# Patient Record
Sex: Male | Born: 1935
Health system: Southern US, Community
[De-identification: ages and names within clinical notes are randomized; demographics above are authoritative.]

## PROBLEM LIST (undated history)

## (undated) DIAGNOSIS — N400 Enlarged prostate without lower urinary tract symptoms: Secondary | ICD-10-CM

## (undated) DIAGNOSIS — Z950 Presence of cardiac pacemaker: Secondary | ICD-10-CM

## (undated) DIAGNOSIS — E119 Type 2 diabetes mellitus without complications: Secondary | ICD-10-CM

## (undated) DIAGNOSIS — E538 Deficiency of other specified B group vitamins: Secondary | ICD-10-CM

## (undated) DIAGNOSIS — I442 Atrioventricular block, complete: Secondary | ICD-10-CM

## (undated) DIAGNOSIS — I739 Peripheral vascular disease, unspecified: Secondary | ICD-10-CM

## (undated) DIAGNOSIS — E785 Hyperlipidemia, unspecified: Secondary | ICD-10-CM

## (undated) DIAGNOSIS — I10 Essential (primary) hypertension: Secondary | ICD-10-CM

## (undated) DIAGNOSIS — I779 Disorder of arteries and arterioles, unspecified: Secondary | ICD-10-CM

## (undated) DIAGNOSIS — R001 Bradycardia, unspecified: Secondary | ICD-10-CM

## (undated) DIAGNOSIS — M25559 Pain in unspecified hip: Secondary | ICD-10-CM

## (undated) HISTORY — DX: Essential (primary) hypertension: I10

## (undated) HISTORY — DX: Benign prostatic hyperplasia without lower urinary tract symptoms: N40.0

## (undated) HISTORY — DX: Deficiency of other specified B group vitamins: E53.8

## (undated) HISTORY — DX: Peripheral vascular disease, unspecified: I73.9

## (undated) HISTORY — DX: Atrioventricular block, complete: I44.2

## (undated) HISTORY — DX: Hyperlipidemia, unspecified: E78.5

## (undated) HISTORY — DX: Presence of cardiac pacemaker: Z95.0

## (undated) HISTORY — DX: Pain in unspecified hip: M25.559

## (undated) HISTORY — PX: CHOLECYSTECTOMY: SHX55

## (undated) HISTORY — DX: Type 2 diabetes mellitus without complications: E11.9

## (undated) HISTORY — DX: Disorder of arteries and arterioles, unspecified: I77.9

## (undated) HISTORY — DX: Bradycardia, unspecified: R00.1

---

## 2008-09-09 ENCOUNTER — Encounter: Admission: RE | Admit: 2008-09-09 | Discharge: 2008-09-09 | Payer: Self-pay | Admitting: Family Medicine

## 2008-11-12 ENCOUNTER — Inpatient Hospital Stay (HOSPITAL_BASED_OUTPATIENT_CLINIC_OR_DEPARTMENT_OTHER): Admission: RE | Admit: 2008-11-12 | Discharge: 2008-11-12 | Payer: Self-pay | Admitting: Cardiology

## 2008-11-12 HISTORY — PX: CORONARY ANGIOPLASTY WITH STENT PLACEMENT: SHX49

## 2009-04-29 ENCOUNTER — Ambulatory Visit: Payer: Self-pay | Admitting: Internal Medicine

## 2009-04-29 ENCOUNTER — Inpatient Hospital Stay (HOSPITAL_COMMUNITY): Admission: AD | Admit: 2009-04-29 | Discharge: 2009-05-01 | Payer: Self-pay | Admitting: Cardiology

## 2009-04-29 HISTORY — PX: PACEMAKER INSERTION: SHX728

## 2009-04-30 ENCOUNTER — Encounter: Payer: Self-pay | Admitting: Internal Medicine

## 2009-05-03 ENCOUNTER — Telehealth: Payer: Self-pay | Admitting: Internal Medicine

## 2009-05-13 ENCOUNTER — Encounter: Payer: Self-pay | Admitting: Internal Medicine

## 2009-05-13 ENCOUNTER — Ambulatory Visit: Payer: Self-pay

## 2009-08-10 ENCOUNTER — Ambulatory Visit: Payer: Self-pay | Admitting: Internal Medicine

## 2010-06-23 ENCOUNTER — Encounter: Payer: Self-pay | Admitting: Internal Medicine

## 2010-06-23 ENCOUNTER — Ambulatory Visit: Payer: Self-pay | Admitting: Internal Medicine

## 2010-06-23 DIAGNOSIS — I498 Other specified cardiac arrhythmias: Secondary | ICD-10-CM

## 2010-06-23 DIAGNOSIS — I10 Essential (primary) hypertension: Secondary | ICD-10-CM | POA: Insufficient documentation

## 2010-06-23 DIAGNOSIS — E785 Hyperlipidemia, unspecified: Secondary | ICD-10-CM

## 2010-08-16 NOTE — Cardiovascular Report (Signed)
Summary: Office Visit   Office Visit   Imported By: Roderic Ovens 08/12/2009 14:01:50  _____________________________________________________________________  External Attachment:    Type:   Image     Comment:   External Document

## 2010-08-16 NOTE — Assessment & Plan Note (Signed)
Summary: pc2. gd   Visit Type:  Pacemaker check Primary Provider:  Dr. Foy Guadalajara  CC:  right leg edema...denies any other cardiac complaints today.  History of Present Illness: [name] is seen in followup for high-grade heart block associated with bradycardia heart rates in the 20s. He is status post pacemaker implantation10/14/2010.    The patient denies SOB, chest pain, edema or palpitations   Current Medications (verified): 1)  Aspirin 325 Mg Tabs (Aspirin) .... One By Mouth Daily 2)  Calcium 500 Mg Tabs (Calcium Carbonate) .... One By Mouth Daily 3)  Ra Fish Oil 1000 Mg Caps (Omega-3 Fatty Acids) .... One By Mouth Daily 4)  Odorless Garlic 810 Mg Tabs (Garlic) .... One By Mouth Daily 5)  Ginkgo Biloba 50 Mg Caps (Ginkgo Biloba) .... One By Mouth Daily 6)  Glucosamine-Chondroitin  Caps (Glucosamine-Chondroit-Vit C-Mn) .... One - Two By Mouth Daily 7)  Niacinamide 100 Mg Tabs (Niacinamide) .... One By Mouth Daily 8)  Terazosin Hcl 10 Mg Caps (Terazosin Hcl) .... One Tab By Mouth Once Daily 9)  Vitamin C 500 Mg Tabs (Ascorbic Acid) .... One By Mouth Daily  Allergies (verified): No Known Drug Allergies  Past History:  Past Medical History: Last updated: 06/23/2010 HYPERLIPIDEMIA HYPERTENSION BRADYCARDIA PACEMAKER,MDT AV BLOCK, COMPLETE INTERMITTENT  Vital Signs:  Patient profile:   77 year old male Height:      63 inches Weight:      177.25 pounds BMI:     31.51 Pulse rate:   79 / minute Pulse rhythm:   regular BP sitting:   126 / 76  (left arm) Cuff size:   regular  Vitals Entered By: Danielle Rankin, CMA (June 23, 2010 10:26 AM)  Physical Exam  General:  The patient was alert and oriented in no acute distress. HEENT Normal.  Neck veins were flat, carotids were brisk.  Lungs were clear.  Heart sounds were regular without murmurs or gallops.  Abdomen was soft with active bowel sounds. There is no clubbing cyanosis or edema. Skin Warm and dry incision is  well-healed   EKG  Procedure date:  06/23/2010  Findings:      sus rhythm with P. synchronous pacing with AV delay of greater than 300 ms  PPM Specifications Following MD:  Sherryl Manges, MD     PPM Vendor:  Medtronic     PPM Model Number:  [id number]     PPM Serial Number:  [id number] PPM DOI:  04/29/2009     PPM Implanting MD:  Sherryl Manges, MD  Lead 1    Location: RA     DOI: 04/29/2009     Model #: 1324     Serial #: [id number]     Status: active Lead 2    Location: RV     DOI: 04/29/2009     Model #: 6440     Serial #: [id number]     Status: active  Magnet Response Rate:  BOL 85 ERI 65  Indications:  High grade heart block   PPM Follow Up Remote Check?  No Battery Voltage:  2.80 V     Battery Est. Longevity:  12.5 years     Pacer Dependent:  Yes       PPM Device Measurements Atrium  Amplitude: 4.0 mV, Impedance: 443 ohms, Threshold: 0.5 V at 0.4 msec Right Ventricle  Impedance: 663 ohms, Threshold: 0.5 V at 0.4 msec  Episodes Coumadin:  No  Parameters Mode:  DDD+  Lower Rate Limit:  60     Upper Rate Limit:  130 Paced AV Delay:  180     Sensed AV Delay:  150 Next Remote Date:  09/22/2010     Next Cardiology Appt Due:  06/17/2011 Tech Comments:  A-V delay reprogrammed as above.  Device function normal.  [facility]link transmissions every 3 months.  ROV 1 year with Dr. Graciela Husbands. Altha Harm, LPN  June 23, 2010 10:59 AM   Impression & Recommendations:  Problem # 1:  AV BLOCK, COMPLETE INTERMITTENT (ICD-426.0) the patient is now device dependent with complete heart block. He is stable post pacemaker.  His updated medication list for this problem includes:    Aspirin 325 Mg Tabs (Aspirin) ..... One by mouth daily  Problem # 2:  PACEMAKER,MDT (ICD-V45.01) Assessment: Deteriorated Device parameters and data were reviewed and he is AV delay was reprogrammed from 300-150 msec   Patient Instructions: 1)  Your physician recommends that you continue on your current  medications as directed. Please refer to the Current Medication list given to you today. 2)  Your physician wants you to follow-up in: 1year  You will receive a reminder letter in the mail two months in advance. If you don't receive a letter, please call our office to schedule the follow-up appointment.

## 2010-08-16 NOTE — Assessment & Plan Note (Signed)
Summary: pc2 medtronic   History of Present Illness: [name] is seen in followup for high-grade heart block associated with bradycardia heart rates in the 20s. He is status post pacemaker implantation10/14/2010.    He is feeling much better that he has "in years." There has been no recurrent dizziness exercise tolerance is improved. There has been no chest pain.  Current Medications (verified): 1)  Aspirin 325 Mg Tabs (Aspirin) .... One By Mouth Daily 2)  Calcium 500 Mg Tabs (Calcium Carbonate) .... One By Mouth Daily 3)  Ra Fish Oil 1000 Mg Caps (Omega-3 Fatty Acids) .... One By Mouth Daily 4)  Odorless Garlic 810 Mg Tabs (Garlic) .... One By Mouth Daily 5)  Ginkgo Biloba 50 Mg Caps (Ginkgo Biloba) .... One By Mouth Daily 6)  Glucosamine-Chondroitin  Caps (Glucosamine-Chondroit-Vit C-Mn) .... One - Two By Mouth Daily 7)  Niacinamide 100 Mg Tabs (Niacinamide) .... One By Mouth Daily 8)  Terazosin Hcl 10 Mg Caps (Terazosin Hcl) .... One Tab By Mouth Once Daily 9)  Vitamin C 500 Mg Tabs (Ascorbic Acid) .... One By Mouth Daily  Allergies (verified): No Known Drug Allergies  Past History:  Vital Signs:  Patient profile:   77 year old male Height:      63 inches Weight:      179.4 pounds Pulse rate:   92 / minute Pulse rhythm:   regular BP sitting:   148 / 86  (left arm)  Vitals Entered By: Minerva Areola, RN, BSN (August 10, 2009 2:18 PM)  Physical Exam  General:  The patient was alert and oriented in no acute distress. HEENT Normal.  Neck veins were flat, carotids were brisk.  Lungs were clear.  Heart sounds were regular without murmurs or gallops.  Abdomen was soft with active bowel sounds. There is no clubbing cyanosis or edema. Skin Warm and dry skin pocket was well-healed   PPM Specifications Following MD:  Sherryl Manges, MD     PPM Vendor:  Medtronic     PPM Model Number:  ADDRL1     PPM Serial Number:  [id number] H PPM DOI:  04/29/2009     PPM Implanting MD:   Sherryl Manges, MD  Lead 1    Location: RA     DOI: 04/29/2009     Model #: 4098     Serial #: [id number]     Status: active Lead 2    Location: RV     DOI: 04/29/2009     Model #: 6213     Serial #: [id number]     Status: active  Magnet Response Rate:  BOL 85 ERI 65  Indications:  High grade heart block   PPM Follow Up Remote Check?  No Battery Voltage:  2.8 V     Battery Est. Longevity:  9.5 years     Pacer Dependent:  No       PPM Device Measurements Atrium  Amplitude: 2.8 mV, Impedance: 471 ohms, Threshold: 0.5 V at 0.4 msec Right Ventricle  Amplitude: 22.40 mV, Impedance: 761 ohms, Threshold: 0.5 V at 0.4 msec  Episodes MS Episodes:  1     Percent Mode Switch:  <0.1%     Coumadin:  No Ventricular High Rate:  0     Atrial Pacing:  8.9%     Ventricular Pacing:  97.5%  Parameters Mode:  DDD+     Lower Rate Limit:  60     Upper Rate Limit:  130 Paced AV Delay:  200     Sensed AV Delay:  200 Tech Comments:  Checked by [name].  ROV 10/11 Dr. Zipporah Plants O'Dell, LPN  August 10, 2009 2:32 PM   Impression & Recommendations:  Problem # 1:  AV BLOCK, COMPLETE INTERMITTENT (ICD-426.0) with intermittent conduction stable post pacer insertion His updated medication list for this problem includes:    Aspirin 325 Mg Tabs (Aspirin) ..... One by mouth daily  His updated medication list for this problem includes:    Aspirin 325 Mg Tabs (Aspirin) ..... One by mouth daily  Problem # 2:  PACEMAKER,MDT (ICD-V45.01) Device parameters and data were reviewed and no changes were made   Pt is conducting through now with AV delay of 250-300 msec.  Options incl A>D mode which mght be associated with long AV delays or continuing as we are with Vpacing  I spoke with Dr Anne Fu and we will do the former with an AV delay of 300 and follow him  He is to see the good Dr Anne Fu in 5 -6 weeks  Other Orders: EKG w/ Interpretation (93000)  Patient Instructions: 1)  Your physician recommends that you  schedule a follow-up as needed   Physical Exam  General:  The patient was alert and oriented in no acute distress. HEENT Normal.  Neck veins were flat, carotids were brisk.  Lungs were clear.  Heart sounds were regular without murmurs or gallops.  Abdomen was soft with active bowel sounds. There is no clubbing cyanosis or edema. Skin Warm and dry skin pocket was well-healed    Impression & Recommendations:  Problem # 1:  AV BLOCK, COMPLETE INTERMITTENT (ICD-426.0) with intermittent conduction stable post pacer insertion His updated medication list for this problem includes:    Aspirin 325 Mg Tabs (Aspirin) ..... One by mouth daily  His updated medication list for this problem includes:    Aspirin 325 Mg Tabs (Aspirin) ..... One by mouth daily  Problem # 2:  PACEMAKER,MDT (ICD-V45.01) Device parameters and data were reviewed and no changes were made   Pt is conducting through now with AV delay of 250-300 msec.  Options incl A>D mode which mght be associated with long AV delays or continuing as we are with Vpacing  I spoke with Dr Anne Fu and we will do the former with an AV delay of 300 and follow him  He is to see the good Dr Anne Fu in 5 -6 weeks  Other Orders: EKG w/ Interpretation (93000)   Current Medications (verified): 1)  Aspirin 325 Mg Tabs (Aspirin) .... One By Mouth Daily 2)  Calcium 500 Mg Tabs (Calcium Carbonate) .... One By Mouth Daily 3)  Ra Fish Oil 1000 Mg Caps (Omega-3 Fatty Acids) .... One By Mouth Daily 4)  Odorless Garlic 810 Mg Tabs (Garlic) .... One By Mouth Daily 5)  Ginkgo Biloba 50 Mg Caps (Ginkgo Biloba) .... One By Mouth Daily 6)  Glucosamine-Chondroitin  Caps (Glucosamine-Chondroit-Vit C-Mn) .... One - Two By Mouth Daily 7)  Niacinamide 100 Mg Tabs (Niacinamide) .... One By Mouth Daily 8)  Terazosin Hcl 10 Mg Caps (Terazosin Hcl) .... One Tab By Mouth Once Daily 9)  Vitamin C 500 Mg Tabs (Ascorbic Acid) .... One By Mouth Daily  Allergies  (verified): No Known Drug Allergies

## 2010-09-27 ENCOUNTER — Encounter: Payer: Self-pay | Admitting: *Deleted

## 2010-10-04 NOTE — Letter (Signed)
Summary: Device-Delinquent Phone Journalist, newspaper, Main Office  [address]   [place], [place] [address]   Phone: [phone]  Fax: [phone]     September 27, 2010 MRN: [id number]   [facility] [address] [place], [place]  [address]   Dear Mr. Heikes,  According to our records, you were scheduled for a device phone transmission on 09-22-2010.     We did not receive any results from this check.  If you transmitted on your scheduled day, please call us to help troubleshoot your system.  If you forgot to send your transmission, please send one upon receipt of this letter.  Thank you,   Architectural technologist [facility]

## 2010-10-20 LAB — COMPREHENSIVE METABOLIC PANEL
ALT: 38 U/L (ref 0–53)
BUN: 13 mg/dL (ref 6–23)
CO2: 27 mEq/L (ref 19–32)
Calcium: 8.6 mg/dL (ref 8.4–10.5)
Chloride: 106 mEq/L (ref 96–112)
Creatinine, Ser: 1.21 mg/dL (ref 0.4–1.5)
GFR calc Af Amer: >60 mL/min (ref >60)
Glucose, Bld: 127 mg/dL — ABNORMAL HIGH (ref 70–99)
Sodium: 142 mEq/L (ref 135–145)
Total Protein: 6.1 g/dL (ref 6.0–8.3)

## 2010-10-20 LAB — APTT: aPTT: 32 seconds (ref 24–37)

## 2010-10-20 LAB — CBC
Hemoglobin: 12.2 g/dL — ABNORMAL LOW (ref 13.0–17.0)
MCHC: 34.5 g/dL (ref 30.0–36.0)
Platelets: 158 10*3/uL (ref 150–400)
RBC: 3.8 MIL/uL — ABNORMAL LOW (ref 4.22–5.81)
RDW: 12.9 % (ref 11.5–15.5)
WBC: 7.8 10*3/uL (ref 4.0–10.5)

## 2010-10-20 LAB — PROTIME-INR: Prothrombin Time: 14.5 seconds (ref 11.6–15.2)

## 2010-10-20 LAB — POCT I-STAT, CHEM 8
BUN: 15 mg/dL (ref 6–23)
Calcium, Ion: 1.12 mmol/L (ref 1.12–1.32)
Chloride: 104 mEq/L (ref 96–112)
Glucose, Bld: 130 mg/dL — ABNORMAL HIGH (ref 70–99)
HCT: 36 % — ABNORMAL LOW (ref 39.0–52.0)
TCO2: 25 mmol/L (ref 0–100)

## 2010-10-20 LAB — BASIC METABOLIC PANEL
Calcium: 8.2 mg/dL — ABNORMAL LOW (ref 8.4–10.5)
GFR calc Af Amer: >60 mL/min (ref >60)
Glucose, Bld: 112 mg/dL — ABNORMAL HIGH (ref 70–99)

## 2010-11-29 NOTE — Cardiovascular Report (Signed)
NAMETRAVARUS, TRUDO NO.:  [id number]   MEDICAL RECORD NO.:  [id number]          PATIENT TYPE:  OIB   LOCATION:  1962                         FACILITY:  [facility]   PHYSICIAN:  Jake Bathe, MD      DATE OF BIRTH:  1924-09-11   DATE OF PROCEDURE:  11/12/2008  DATE OF DISCHARGE:  11/12/2008                            CARDIAC CATHETERIZATION   PROCEDURES:  1. Left heart catheterization.  2. Selective coronary angiography.  3. Left ventriculogram.   INDICATIONS:  An 77 year old male with a history of syncope and  increased dyspnea on exertion with carotid Doppler showing 50-70% left  internal carotid artery plaque, history of left bundle-branch block and  recent nuclear stress test demonstrating apical/apical lateral wall with  reversible defect.  His ejection fraction was 69% on nuclear stress  test.  On echocardiogram, his ejection fraction was 45-50% with  inferobasal anteroseptal thinning and akinesis noted.   PROCEDURE DETAILS:  Informed consent was obtained.  Risk of stroke,  heart attack, death, renal impairment were explained to the patient at  length.  He was placed on the catheterization table, prepped in a  sterile fashion.  Lidocaine 1% was used for local anesthesia.  Fluoroscopy was used to visualize the femoral head.  A modified  Seldinger technique was used to place a 4-French sheath in the right  femoral artery.  A  Judkins left #4 catheter was used to selectively  cannulate the left main artery and a no-torque Williams right catheter  was used to selectively cannulate the right coronary artery.  Multiple  views with hand injection of Omnipaque were obtained.  An angled pigtail  catheter was used for left ventriculogram and for hemodynamics across  the aortic valve.  In the RAO position, a left ventriculogram was  performed with power injection of 36 mL of contrast.   FINDINGS:  1. Left main artery - splits into the left anterior descending,  ramus,      circumflex artery.  There was no angiographically significant      coronary artery disease.  2. Left anterior descending artery - there was one diagonal branch.      The mid-to-distal vessel is small in caliber, but there was no      angiographically significant coronary artery disease present.  3. Ramus branch - widely patent.  No angiographically significant      coronary artery disease.  4. Circumflex artery - there is the dominant vessel giving rise to the      posterior descending artery.  There was no angiographically      significant coronary artery disease.  There was one other obtuse      marginal branch.  5. Right coronary artery - small nondominant, no significant disease      present.  6. Left ventriculogram.  The inferoapical wall demonstrates minor      ballooning; however, ejection fraction appears to be 55%.  There      was no significant akinetic segments noted.  No significant mitral      regurgitation.  There was no aortic valve gradient.   HEMODYNAMICS:  Left ventricle 111 systolic with an end-diastolic  pressure of 18 mmHg.  Aortic pressure 100/45 with a mean of 63 - a mean  aortic valve gradient of 9.3 mmHg was calculated, less than mild.   IMPRESSION:  1. No angiographically significant coronary artery disease - mid-to-      distal left anterior descending, is small in caliber and given this      vessel diameter size.  This may be the reason for his abnormal      nuclear stress test.  2. Normal left ventricular ejection fraction of approximately 55% with      findings as above.  3. Mild aortic valve gradient as noted above.   PLAN:  Continue with medical management.  Imdur 30mg  started.  Treatment  of diastolic dysfunction.  We will see back in clinic in 2 weeks.      Jake Bathe, MD  Electronically Signed     MCS/MEDQ  D:  11/12/2008  T:  11/12/2008  Job:  [id number]   cc:   Dr. Jean Rosenthal

## 2011-01-20 ENCOUNTER — Telehealth: Payer: Self-pay

## 2011-01-20 DIAGNOSIS — R932 Abnormal findings on diagnostic imaging of liver and biliary tract: Secondary | ICD-10-CM

## 2011-01-20 NOTE — Telephone Encounter (Signed)
Pt scheduled for EUS has been  instructed and meds reviewed.  He will call with any further questions

## 2011-02-07 ENCOUNTER — Telehealth: Payer: Self-pay | Admitting: Gastroenterology

## 2011-02-07 NOTE — Telephone Encounter (Signed)
Procedure cx with Noreene Larsson.  FYI Dr Christella Hartigan

## 2011-02-07 NOTE — Telephone Encounter (Signed)
ok 

## 2011-02-16 ENCOUNTER — Encounter: Payer: Medicare Other | Admitting: Gastroenterology

## 2011-03-03 ENCOUNTER — Telehealth: Payer: Self-pay

## 2011-03-03 NOTE — Telephone Encounter (Signed)
Dr Christella Hartigan this pt was scheduled for EUS several weeks ago and was cx by Dr Braulio Conte because the pt was in the hospital.  They are calling back to reschedule now is this ok to reschedule?

## 2011-03-06 NOTE — Telephone Encounter (Signed)
Records are on your desk

## 2011-03-06 NOTE — Telephone Encounter (Signed)
Can you put his EUS "packet" on my desk to review.  I don't recall what it was about.

## 2011-03-08 NOTE — Telephone Encounter (Signed)
Dr Christella Hartigan request new pt appt before EUS scheduled.  Dr Meisnhiemer's nurse Clydie Braun called and appt made new pt packet mailed

## 2011-04-07 ENCOUNTER — Ambulatory Visit (INDEPENDENT_AMBULATORY_CARE_PROVIDER_SITE_OTHER): Payer: Medicare Other | Admitting: Gastroenterology

## 2011-04-07 ENCOUNTER — Encounter: Payer: Self-pay | Admitting: Gastroenterology

## 2011-04-07 VITALS — BP 132/78 | HR 80 | Ht 64.0 in | Wt 165.0 lb

## 2011-04-07 DIAGNOSIS — R933 Abnormal findings on diagnostic imaging of other parts of digestive tract: Secondary | ICD-10-CM

## 2011-04-07 NOTE — Progress Notes (Signed)
HPI: This is a   very pleasant 77 year old man who was hospitalized and asked for a this past April and then also about 2 months later. He initially presented with cholangitis, common bile duct stones, cholecystitis. He underwent an ERCP with stone removal. There was question of a biliary stricture at that time. Biliary brushings showed slightly atypical cells however no definite diagnosis of malignancy. There was also questionable liver mass and for that he had a liver biopsy during surgery, cholecystectomy. The liver biopsy suggested actually that he had infection in his liver. Indeed a month or so later he presented with intra-abdominal abscess that required drain placement. Initial imaging suggested a unusual area in the head of his pancreas. The ERCP did not document periventricular diverticulum but a repeat CT scan at the time of his abscess diagnosis did show he had a periampullary duodenal diverticulum.  Drain placed into abscess, stayed in for 2 weeks.  The drain has been out for a couple months.  He is finally getting back to his usual.  Working again.  He does have some peripheral neuropathy, mild.  No abd pains.  No jaundice.  He did have night sweats prior but none since (in past 2-3 months).  There is still a question about pancreas.    CT     Review of systems: Pertinent positive and negative review of systems were noted in the above HPI section.  All other review of systems was otherwise negative.   Past Medical History  Diagnosis Date  . Hyperlipidemia   . Hypertension   . Bradycardia   . Pacemaker   . AV block   . B12 deficiency     Past Surgical History  Procedure Date  . Pacemaker insertion   . Cholecystectomy      reports that he has never smoked. He has never used smokeless tobacco. He reports that he does not drink alcohol or use illicit drugs.  family history is not on file.    Current Medications, Allergies were all reviewed with the patient via [facility] electronic medical record system.    Physical Exam: BP 132/78  Pulse 80  Ht 5\' 4"  (1.626 m)  Wt 165 lb (74.844 kg)  BMI 28.32 kg/m2 Constitutional: generally well-appearing Psychiatric: alert and oriented x3 Eyes: extraocular movements intact Mouth: oral pharynx moist, no lesions Neck: supple no lymphadenopathy Cardiovascular: heart regular rate and rhythm Lungs: clear to auscultation bilaterally Abdomen: soft, nontender, nondistended, no obvious ascites, no peritoneal signs, normal bowel sounds Extremities: no lower extremity edema bilaterally Skin: no lesions on visible extremities    Assessment and plan: 77 y.o. male with abnormal appearing pancreas on imaging, recent cholangitis, intra-abdominal abscess, status post ERCP, atypical cells on biliary brushing  The patient proceed with endoscopic ultrasound at his soonest convenience. He looks remarkably well for all that he has been through. Suspect he has no lingering infection. He was never alcohol drinker, he has no family history of pancreatic problems and no pancreas problems himself in the past.

## 2011-04-07 NOTE — Patient Instructions (Signed)
You will be set up for an upper EUS at [facility], next available, radial scope.  Routine sedation. A copy of this information will be made available to Dr. Jennye Boroughs.

## 2011-04-10 ENCOUNTER — Encounter: Payer: Self-pay | Admitting: Gastroenterology

## 2011-05-04 ENCOUNTER — Ambulatory Visit (HOSPITAL_COMMUNITY)
Admission: RE | Admit: 2011-05-04 | Discharge: 2011-05-04 | Disposition: A | Discharge status: Home / Self Care | Payer: Medicare Other | Source: Ambulatory Visit | Attending: Gastroenterology | Admitting: Gastroenterology

## 2011-05-04 ENCOUNTER — Encounter: Payer: Medicare Other | Admitting: Gastroenterology

## 2011-05-04 DIAGNOSIS — E785 Hyperlipidemia, unspecified: Secondary | ICD-10-CM | POA: Insufficient documentation

## 2011-05-04 DIAGNOSIS — R933 Abnormal findings on diagnostic imaging of other parts of digestive tract: Secondary | ICD-10-CM

## 2011-05-04 DIAGNOSIS — I10 Essential (primary) hypertension: Secondary | ICD-10-CM | POA: Insufficient documentation

## 2011-05-04 DIAGNOSIS — Z9089 Acquired absence of other organs: Secondary | ICD-10-CM | POA: Insufficient documentation

## 2011-05-04 DIAGNOSIS — Z950 Presence of cardiac pacemaker: Secondary | ICD-10-CM | POA: Insufficient documentation

## 2013-07-07 ENCOUNTER — Encounter: Payer: Self-pay | Admitting: Internal Medicine

## 2013-09-03 ENCOUNTER — Encounter: Payer: Medicare Other | Admitting: Internal Medicine

## 2013-09-29 ENCOUNTER — Encounter: Payer: Self-pay | Admitting: Internal Medicine

## 2013-09-29 ENCOUNTER — Ambulatory Visit (INDEPENDENT_AMBULATORY_CARE_PROVIDER_SITE_OTHER): Payer: Medicare Other | Admitting: Internal Medicine

## 2013-09-29 VITALS — BP 146/79 | HR 73 | Ht 64.0 in | Wt 187.4 lb

## 2013-09-29 DIAGNOSIS — I498 Other specified cardiac arrhythmias: Secondary | ICD-10-CM

## 2013-09-29 DIAGNOSIS — I442 Atrioventricular block, complete: Secondary | ICD-10-CM

## 2013-09-29 LAB — MDC_IDC_ENUM_SESS_TYPE_INCLINIC
Battery Impedance: 226 Ohm
Battery Remaining Longevity: 114 mo
Battery Voltage: 2.8 V
Lead Channel Pacing Threshold Amplitude: 0.5 V
Lead Channel Pacing Threshold Pulse Width: 0.4 ms
Lead Channel Setting Pacing Amplitude: 1.5 V
Lead Channel Setting Pacing Amplitude: 2.5 V
Lead Channel Setting Pacing Pulse Width: 0.4 ms
MDC IDC MSMT LEADCHNL RA IMPEDANCE VALUE: 450 Ohm
MDC IDC MSMT LEADCHNL RA PACING THRESHOLD AMPLITUDE: 0.75 V
MDC IDC MSMT LEADCHNL RA PACING THRESHOLD PULSEWIDTH: 0.4 ms
MDC IDC MSMT LEADCHNL RA SENSING INTR AMPL: 2.8 mV
MDC IDC MSMT LEADCHNL RV IMPEDANCE VALUE: 744 Ohm
MDC IDC SESS DTM: 20150316111004
MDC IDC SET LEADCHNL RV SENSING SENSITIVITY: 5.6 mV
MDC IDC STAT BRADY AP VP PERCENT: 33 %
MDC IDC STAT BRADY AP VS PERCENT: 0 %
MDC IDC STAT BRADY AS VP PERCENT: 67 %
MDC IDC STAT BRADY AS VS PERCENT: 0 %

## 2013-09-29 NOTE — Patient Instructions (Addendum)
Your physician wants you to follow-up in: 12 months with Dr Gari Crown will receive a reminder letter in the mail two months in advance. If you don't receive a letter, please call our office to schedule the follow-up appointment.  Remote monitoring is used to monitor your Pacemaker of ICD from home. This monitoring reduces the number of office visits required to check your device to one time per year. It allows [place] to keep an eye on the functioning of your device to ensure it is working properly. You are scheduled for a device check from home on 12/31/13. You may send your transmission at any time that day. If you have a wireless device, the transmission will be sent automatically. After your physician reviews your transmission, you will receive a postcard with your next transmission date.

## 2013-09-29 NOTE — Progress Notes (Signed)
REDDING [name]., MD:  Primary Cardiologist:  Dr Kennieth Francois Scott [name] is a 80 y.o. male with a h/o bradycardia sp PPM (MDT) by Dr Caryl Comes who presents today to establish care in the Electrophysiology device clinic.   The patient reports doing very well since having a pacemaker implanted and remains very active despite his age.   Today, he  denies symptoms of palpitations, chest pain, shortness of breath, orthopnea, PND, lower extremity edema, dizziness, presyncope, syncope, or neurologic sequela.  The patientis tolerating medications without difficulties and is otherwise without complaint today.   Past Medical History  Diagnosis Date  . Hyperlipidemia   . Hypertension   . Pacemaker   . Complete heart block   . B12 deficiency   . Pain in joint, pelvic region and thigh   . Carotid artery disease   . BPH (benign prostatic hyperplasia)   . PVD (peripheral vascular disease)     LICA 07-37% - PVD   Past Surgical History  Procedure Laterality Date  . Pacemaker insertion  04/29/09    Medtronic Adapta L implanted by Dr Caryl Comes for AV block  . Cholecystectomy    . Cardiac catheterization    . Coronary angioplasty with stent placement  11/12/08    No angiographically significant CAD - mid to distal LAD small in caliber, question a degree of vasospasm. LVEDP-87mmHg, aoritc valve stenosis very mild 9.34mmHg.    History   Social History  . Marital Status: Married    Spouse Name: N/A    Number of Children: 60  . Years of Education: N/A   Occupational History  . Security     Social History Main Topics  . Smoking status: Never Smoker   . Smokeless tobacco: Never Used  . Alcohol Use: No  . Drug Use: No  . Sexual Activity: Not on file   Other Topics Concern  . Not on file   Social History Narrative   2 caffeine drinks daily     No family history on file.  No Known Allergies  Current Outpatient Prescriptions  Medication Sig Dispense Refill  . aspirin 325 MG tablet Take  325 mg by mouth daily.        Marland Kitchen atorvastatin (LIPITOR) 20 MG tablet Take 20 mg by mouth daily.      . Calcium Carbonate-Vitamin D (CALCIUM 500 + D PO) Take 1 capsule by mouth daily.       Marland Kitchen gabapentin (NEURONTIN) 300 MG capsule Take 300 mg by mouth 3 (three) times daily.        Marland Kitchen glucosamine-chondroitin 500-400 MG tablet Take 1-2 tablets by mouth daily.        Marland Kitchen lisinopril (PRINIVIL,ZESTRIL) 10 MG tablet Take 10 mg by mouth daily.      . metoprolol succinate (TOPROL-XL) 50 MG 24 hr tablet Take 1 tablet by mouth daily.      . silodosin (RAPAFLO) 8 MG CAPS capsule Take 8 mg by mouth daily with breakfast.        . vitamin B-12 (CYANOCOBALAMIN) 1000 MCG tablet Take 1,000 mcg by mouth daily.      . vitamin C (ASCORBIC ACID) 500 MG tablet Take 500 mg by mouth daily.         No current facility-administered medications for this visit.    ROS- all systems are reviewed and negative except as per HPI  Physical Exam: Filed Vitals:   09/29/13 1018  BP: 146/79  Pulse: 73  Height: 5\' 4"  (1.626  m)  Weight: 187 lb 6.4 oz (85.004 kg)    GEN- The patient is well appearing, alert and oriented x 3 today.   Head- normocephalic, atraumatic Eyes-  Sclera clear, conjunctiva pink Ears- hearing intact Oropharynx- clear Neck- supple, no JVP Lymph- no cervical lymphadenopathy Lungs- Clear to ausculation bilaterally, normal work of breathing Chest- pacemaker pocket is well healed Heart- Regular rate and rhythm, no murmurs, rubs or gallops, PMI not laterally displaced GI- soft, NT, ND, + BS Extremities- no clubbing, cyanosis, or edema MS- no significant deformity or atrophy Skin- no rash or lesion Psych- euthymic mood, full affect Neuro- strength and sensation are intact  Pacemaker interrogation- reviewed in detail today,  See PACEART report  Assessment and Plan:  1. Complete heart block Normal pacemaker function See Pace Art report No changes today  2. htn Stable No change required  today  carelink Return to see Dr Caryl Comes in 1 year Follow-up with Dr Marlou Porch as scheduled

## 2013-10-06 ENCOUNTER — Encounter: Payer: Medicare Other | Admitting: Internal Medicine

## 2013-10-22 ENCOUNTER — Encounter: Payer: Self-pay | Admitting: Internal Medicine

## 2013-12-31 ENCOUNTER — Telehealth: Payer: Self-pay | Admitting: Cardiology

## 2013-12-31 ENCOUNTER — Ambulatory Visit (INDEPENDENT_AMBULATORY_CARE_PROVIDER_SITE_OTHER): Payer: Medicare Other | Admitting: *Deleted

## 2013-12-31 DIAGNOSIS — I442 Atrioventricular block, complete: Secondary | ICD-10-CM

## 2013-12-31 LAB — MDC_IDC_ENUM_SESS_TYPE_REMOTE
Battery Remaining Longevity: 101 mo
Battery Voltage: 2.79 V
Brady Statistic AP VP Percent: 40.1 %
Lead Channel Impedance Value: 731 Ohm
Lead Channel Pacing Threshold Amplitude: 0.375 V
Lead Channel Pacing Threshold Pulse Width: 0.4 ms
Lead Channel Pacing Threshold Pulse Width: 0.4 ms
Lead Channel Sensing Intrinsic Amplitude: 1.4 mV
Lead Channel Setting Pacing Amplitude: 1.5 V
Lead Channel Setting Sensing Sensitivity: 5.6 mV
MDC IDC MSMT LEADCHNL RA IMPEDANCE VALUE: 469 Ohm
MDC IDC MSMT LEADCHNL RA PACING THRESHOLD AMPLITUDE: 0.625 V
MDC IDC SET LEADCHNL RV PACING AMPLITUDE: 2.5 V
MDC IDC SET LEADCHNL RV PACING PULSEWIDTH: 0.4 ms
MDC IDC STAT BRADY AP VS PERCENT: 0.1 %
MDC IDC STAT BRADY AS VP PERCENT: 59.7 %
MDC IDC STAT BRADY AS VS PERCENT: 0.2 %

## 2013-12-31 NOTE — Telephone Encounter (Signed)
Spoke with pt and reminded pt of remote transmission that is due today. Pt verbalized understanding.   

## 2013-12-31 NOTE — Progress Notes (Signed)
Remote pacemaker transmission.   

## 2014-01-20 ENCOUNTER — Encounter: Payer: Self-pay | Admitting: Cardiology

## 2014-01-28 ENCOUNTER — Encounter: Payer: Self-pay | Admitting: Cardiology

## 2014-04-06 ENCOUNTER — Ambulatory Visit (INDEPENDENT_AMBULATORY_CARE_PROVIDER_SITE_OTHER): Payer: Medicare Other | Admitting: *Deleted

## 2014-04-06 ENCOUNTER — Telehealth: Payer: Self-pay | Admitting: Cardiology

## 2014-04-06 DIAGNOSIS — I442 Atrioventricular block, complete: Secondary | ICD-10-CM

## 2014-04-06 NOTE — Telephone Encounter (Signed)
Spoke with pt and reminded pt of remote transmission that is due today. Pt verbalized understanding.   

## 2014-04-06 NOTE — Progress Notes (Signed)
Remote pacemaker transmission.   

## 2014-04-07 LAB — MDC_IDC_ENUM_SESS_TYPE_REMOTE
Battery Impedance: 297 Ohm
Brady Statistic AP VP Percent: 37 %
Brady Statistic AP VS Percent: 0 %
Brady Statistic AS VS Percent: 0 %
Date Time Interrogation Session: 20150921172124
Lead Channel Impedance Value: 470 Ohm
Lead Channel Impedance Value: 779 Ohm
Lead Channel Pacing Threshold Amplitude: 0.375 V
Lead Channel Pacing Threshold Amplitude: 0.625 V
Lead Channel Pacing Threshold Pulse Width: 0.4 ms
Lead Channel Pacing Threshold Pulse Width: 0.4 ms
Lead Channel Setting Pacing Amplitude: 2.5 V
Lead Channel Setting Sensing Sensitivity: 5.6 mV
MDC IDC MSMT BATTERY REMAINING LONGEVITY: 106 mo
MDC IDC MSMT BATTERY VOLTAGE: 2.79 V
MDC IDC MSMT LEADCHNL RA SENSING INTR AMPL: 1.4 mV
MDC IDC SET LEADCHNL RA PACING AMPLITUDE: 1.5 V
MDC IDC SET LEADCHNL RV PACING PULSEWIDTH: 0.4 ms
MDC IDC STAT BRADY AS VP PERCENT: 63 %

## 2014-04-20 ENCOUNTER — Encounter: Payer: Self-pay | Admitting: Cardiology

## 2014-04-23 ENCOUNTER — Encounter: Payer: Self-pay | Admitting: Cardiology

## 2014-07-07 ENCOUNTER — Other Ambulatory Visit: Payer: Self-pay

## 2014-07-07 ENCOUNTER — Ambulatory Visit (INDEPENDENT_AMBULATORY_CARE_PROVIDER_SITE_OTHER): Payer: Medicare Other | Admitting: *Deleted

## 2014-07-07 DIAGNOSIS — I442 Atrioventricular block, complete: Secondary | ICD-10-CM

## 2014-07-07 LAB — MDC_IDC_ENUM_SESS_TYPE_INCLINIC
Battery Remaining Longevity: 102 mo
Brady Statistic AS VP Percent: 64.1 %
Lead Channel Impedance Value: 759 Ohm
Lead Channel Pacing Threshold Amplitude: 0.375 V
Lead Channel Pacing Threshold Pulse Width: 0.4 ms
Lead Channel Pacing Threshold Pulse Width: 0.4 ms
Lead Channel Setting Pacing Amplitude: 1.5 V
Lead Channel Setting Pacing Amplitude: 2.5 V
Lead Channel Setting Pacing Pulse Width: 0.4 ms
MDC IDC MSMT BATTERY VOLTAGE: 2.8 V
MDC IDC MSMT LEADCHNL RA IMPEDANCE VALUE: 463 Ohm
MDC IDC MSMT LEADCHNL RA PACING THRESHOLD AMPLITUDE: 0.625 V
MDC IDC MSMT LEADCHNL RA SENSING INTR AMPL: 1.4 mV
MDC IDC SET LEADCHNL RV SENSING SENSITIVITY: 5.6 mV
MDC IDC STAT BRADY AP VP PERCENT: 35.8 %
MDC IDC STAT BRADY AP VS PERCENT: 0.1 %
MDC IDC STAT BRADY AS VS PERCENT: 0.2 %

## 2014-07-07 NOTE — Progress Notes (Signed)
Remote pacemaker transmission.   

## 2014-07-20 DIAGNOSIS — R748 Abnormal levels of other serum enzymes: Secondary | ICD-10-CM | POA: Diagnosis not present

## 2014-07-20 DIAGNOSIS — R945 Abnormal results of liver function studies: Secondary | ICD-10-CM | POA: Diagnosis not present

## 2014-07-23 ENCOUNTER — Encounter: Payer: Self-pay | Admitting: Cardiology

## 2014-07-27 ENCOUNTER — Encounter: Payer: Self-pay | Admitting: Cardiology

## 2014-08-12 DIAGNOSIS — R748 Abnormal levels of other serum enzymes: Secondary | ICD-10-CM | POA: Diagnosis not present

## 2014-08-17 ENCOUNTER — Encounter: Payer: Self-pay | Admitting: Vascular Surgery

## 2014-08-18 ENCOUNTER — Encounter: Payer: Self-pay | Admitting: Vascular Surgery

## 2014-08-18 ENCOUNTER — Ambulatory Visit (INDEPENDENT_AMBULATORY_CARE_PROVIDER_SITE_OTHER): Payer: Medicare Other | Admitting: Vascular Surgery

## 2014-08-18 VITALS — BP 151/77 | HR 61 | Resp 18 | Ht 63.0 in | Wt 183.0 lb

## 2014-08-18 DIAGNOSIS — I6523 Occlusion and stenosis of bilateral carotid arteries: Secondary | ICD-10-CM

## 2014-08-18 NOTE — Progress Notes (Signed)
Patient name: Scott [name] MRN: [id number] DOB: 1924-10-29 Sex: male   Referred by: Lin Landsman  Reason for referral:  Chief Complaint  Patient presents with  . New Evaluation    carotid stenosis referred by Dr Lin Landsman    HISTORY OF PRESENT ILLNESS: Patient is very pleasant 81 year old gentleman seen today for discussion of recent carotid duplex evaluation at [facility] on 07/03/2014. He denies any prior neurologic deficits. Specifically no amaurosis fugax transient ischemic attack or stroke. Does have a history dating back to approximately 2010 with coronary disease. Reports that he was told that time he had approximate 50% stenosis in his carotid arteries. Have more recent issue of severe bradycardia and had pacemaker placement and was had marked improvement in his tiredness since that time. No recent cardiac difficulties.  Past Medical History  Diagnosis Date  . Hyperlipidemia   . Hypertension   . Pacemaker   . Complete heart block   . B12 deficiency   . Pain in joint, pelvic region and thigh   . Carotid artery disease   . BPH (benign prostatic hyperplasia)   . PVD (peripheral vascular disease)     LICA 68-03% - PVD    Past Surgical History  Procedure Laterality Date  . Pacemaker insertion  04/29/09    Medtronic Adapta L implanted by Dr Caryl Comes for AV block  . Cholecystectomy    . Cardiac catheterization    . Coronary angioplasty with stent placement  11/12/08    No angiographically significant CAD - mid to distal LAD small in caliber, question a degree of vasospasm. LVEDP-5mmHg, aoritc valve stenosis very mild 9.76mmHg.    History   Social History  . Marital Status: Married    Spouse Name: N/A    Number of Children: 43  . Years of Education: N/A   Occupational History  . Security     Social History Main Topics  . Smoking status: Never Smoker   . Smokeless tobacco: Never Used  . Alcohol Use: No  . Drug Use: No  . Sexual Activity: Not on file    Other Topics Concern  . Not on file   Social History Narrative   2 caffeine drinks daily     History reviewed. No pertinent family history.  Allergies as of 08/18/2014  . (No Known Allergies)    Current Outpatient Prescriptions on File Prior to Visit  Medication Sig Dispense Refill  . aspirin 325 MG tablet Take 325 mg by mouth daily.      . Calcium Carbonate-Vitamin D (CALCIUM 500 + D PO) Take 1 capsule by mouth daily.     Marland Kitchen gabapentin (NEURONTIN) 300 MG capsule Take 300 mg by mouth 3 (three) times daily.      Marland Kitchen lisinopril (PRINIVIL,ZESTRIL) 10 MG tablet Take 10 mg by mouth daily.    . metoprolol succinate (TOPROL-XL) 50 MG 24 hr tablet Take 1 tablet by mouth daily.    . silodosin (RAPAFLO) 8 MG CAPS capsule Take 8 mg by mouth daily with breakfast.      . vitamin B-12 (CYANOCOBALAMIN) 1000 MCG tablet Take 1,000 mcg by mouth daily.    . vitamin C (ASCORBIC ACID) 500 MG tablet Take 500 mg by mouth daily.      Marland Kitchen atorvastatin (LIPITOR) 20 MG tablet Take 20 mg by mouth daily.    Marland Kitchen glucosamine-chondroitin 500-400 MG tablet Take 1-2 tablets by mouth daily.       No current facility-administered medications on file prior to  visit.     REVIEW OF SYSTEMS:  Positives indicated with an "X"  CARDIOVASCULAR:  [ ]  chest pain   [ ]  chest pressure   [ ]  palpitations   [ ]  orthopnea   [ ]  dyspnea on exertion   [ ]  claudication   [ ]  rest pain   [ ]  DVT   [ ]  phlebitis PULMONARY:   [ ]  productive cough   [ ]  asthma   [ ]  wheezing NEUROLOGIC:   [ ]  weakness  [ ]  paresthesias  [ ]  aphasia  [ ]  amaurosis  [ ]  dizziness HEMATOLOGIC:   [ ]  bleeding problems   [ ]  clotting disorders MUSCULOSKELETAL:  [ ]  joint pain   [ ]  joint swelling GASTROINTESTINAL: [ ]   blood in stool  [ ]   hematemesis GENITOURINARY:  [ ]   dysuria  [ ]   hematuria PSYCHIATRIC:  [ ]  history of major depression INTEGUMENTARY:  [ ]  rashes  [ ]  ulcers CONSTITUTIONAL:  [ ]  fever   [ ]  chills  PHYSICAL EXAMINATION:  General:  The patient is a well-nourished male, in no acute distress. Vital signs are BP 151/77 mmHg  Pulse 61  Resp 18  Ht 5\' 3"  (1.6 m)  Wt 183 lb (83.008 kg)  BMI 32.43 kg/m2 Pulmonary: There is a good air exchange bilaterally without wheezing or rales.  Musculoskeletal: There are no major deformities.  There is no significant extremity pain. Neurologic: No focal weakness or paresthesias are detected, Skin: There are no ulcer or rashes noted. Psychiatric: The patient has normal affect. Cardiovascular: 2+ femoral and 2+ radial pulses. 2+ dorsalis pedis pulses bilaterally Carotid arteries without bruits bilaterally  I reviewed his studies from [facility]. This reveals moderate bilateral carotid stenosis. This is somewhat more significant up to 70% in his right carotid bulb.  Impression and Plan:  Asymptomatic moderate carotid stenosis and 81 year old gentleman. I discussed symptoms of carotid disease at length to the patient and his wife present. I explained that we would recommend reimaging this in one year to rule out any progression. I explained that if he develops severe stenosis that we would consider elective endarterectomy. He is quite active and animated. His main concern is how long this would keep him from returning to work. I explained that this would only require a 1-2 week break from work. I suspect that he will not require any treatment lifelong. We will see him again in one year for continued follow-up and repeat ultrasound    Megen Madewell [facility] [place] Office: [phone]

## 2014-08-19 ENCOUNTER — Other Ambulatory Visit: Payer: Self-pay | Admitting: *Deleted

## 2014-08-19 DIAGNOSIS — I6523 Occlusion and stenosis of bilateral carotid arteries: Secondary | ICD-10-CM

## 2014-08-27 DIAGNOSIS — K5732 Diverticulitis of large intestine without perforation or abscess without bleeding: Secondary | ICD-10-CM | POA: Diagnosis not present

## 2014-08-27 DIAGNOSIS — R7989 Other specified abnormal findings of blood chemistry: Secondary | ICD-10-CM | POA: Diagnosis not present

## 2014-09-25 DIAGNOSIS — R7989 Other specified abnormal findings of blood chemistry: Secondary | ICD-10-CM | POA: Diagnosis not present

## 2014-10-09 ENCOUNTER — Encounter: Payer: Self-pay | Admitting: Internal Medicine

## 2014-10-09 ENCOUNTER — Encounter: Payer: Medicare Other | Admitting: Internal Medicine

## 2014-10-09 ENCOUNTER — Ambulatory Visit (INDEPENDENT_AMBULATORY_CARE_PROVIDER_SITE_OTHER): Payer: Medicare Other | Admitting: Internal Medicine

## 2014-10-09 VITALS — BP 138/82 | HR 86 | Ht 63.0 in | Wt 184.6 lb

## 2014-10-09 DIAGNOSIS — Z45018 Encounter for adjustment and management of other part of cardiac pacemaker: Secondary | ICD-10-CM

## 2014-10-09 DIAGNOSIS — I442 Atrioventricular block, complete: Secondary | ICD-10-CM

## 2014-10-09 LAB — MDC_IDC_ENUM_SESS_TYPE_INCLINIC
Battery Impedance: 321 Ohm
Brady Statistic AP VS Percent: 0 %
Brady Statistic AS VS Percent: 0 %
Date Time Interrogation Session: 20160325154438
Lead Channel Pacing Threshold Amplitude: 0.5 V
Lead Channel Pacing Threshold Amplitude: 0.5 V
Lead Channel Pacing Threshold Pulse Width: 0.4 ms
Lead Channel Setting Pacing Amplitude: 2.5 V
Lead Channel Setting Pacing Pulse Width: 0.4 ms
Lead Channel Setting Sensing Sensitivity: 5.6 mV
MDC IDC MSMT BATTERY REMAINING LONGEVITY: 104 mo
MDC IDC MSMT BATTERY VOLTAGE: 2.79 V
MDC IDC MSMT LEADCHNL RA IMPEDANCE VALUE: 476 Ohm
MDC IDC MSMT LEADCHNL RA SENSING INTR AMPL: 4 mV
MDC IDC MSMT LEADCHNL RV IMPEDANCE VALUE: 800 Ohm
MDC IDC MSMT LEADCHNL RV PACING THRESHOLD PULSEWIDTH: 0.4 ms
MDC IDC SET LEADCHNL RA PACING AMPLITUDE: 1.5 V
MDC IDC STAT BRADY AP VP PERCENT: 31 %
MDC IDC STAT BRADY AS VP PERCENT: 69 %

## 2014-10-09 MED ORDER — ASPIRIN EC 81 MG PO TBEC: 81.0000 mg | DELAYED_RELEASE_TABLET | Freq: Every day | ORAL | Status: DC

## 2014-10-09 MED ORDER — FUROSEMIDE 20 MG PO TABS: ORAL_TABLET | ORAL | Status: DC

## 2014-10-09 NOTE — Progress Notes (Signed)
Patient Care Team: Angelina Sheriff, MD as PCP - General (Unknown Physician Specialty)   HPI  Scott [name] is a 81 y.o. male Seen in follow-up for pacemaker implanted many years ago. He has complete heart block. He saw Dr. Rayann Heman about a year ago.  He has had problems with peripheral edema and some dyspnea on exertion. He has had no syncope. He denies chest pain. Remote catheterization demonstrated no obstructive coronary disease (2010) Past Medical History  Diagnosis Date  . Hyperlipidemia   . Hypertension   . Pacemaker   . Complete heart block   . B12 deficiency   . Pain in joint, pelvic region and thigh   . Carotid artery disease   . BPH (benign prostatic hyperplasia)   . PVD (peripheral vascular disease)     LICA 37-85% - PVD    Past Surgical History  Procedure Laterality Date  . Pacemaker insertion  04/29/09    Medtronic Adapta L implanted by Dr Caryl Comes for AV block  . Cholecystectomy    . Cardiac catheterization    . Coronary angioplasty with stent placement  11/12/08    No angiographically significant CAD - mid to distal LAD small in caliber, question a degree of vasospasm. LVEDP-58mmHg, aoritc valve stenosis very mild 9.77mmHg.    Current Outpatient Prescriptions  Medication Sig Dispense Refill  . aspirin 325 MG tablet Take 325 mg by mouth daily.      . Calcium Carbonate-Vitamin D (CALCIUM 500 + D PO) Take 1 capsule by mouth daily.     . Cholecalciferol (VITAMIN D3) 1000 UNITS CAPS Take 1,000 Units by mouth daily.    Marland Kitchen gabapentin (NEURONTIN) 300 MG capsule Take 300 mg by mouth 3 (three) times daily.      . Garlic 8850 MG CAPS Take 1,000 mg by mouth daily.    . Glucosamine-Chondroitin-MSM (TRIPLE FLEX PO) Take 1 tablet by mouth 3 (three) times daily.    Marland Kitchen lisinopril (PRINIVIL,ZESTRIL) 10 MG tablet Take 10 mg by mouth daily.    . metoprolol succinate (TOPROL-XL) 50 MG 24 hr tablet Take 1 tablet by mouth daily.    . silodosin (RAPAFLO) 8 MG CAPS capsule  Take 8 mg by mouth daily with breakfast.      . vitamin B-12 (CYANOCOBALAMIN) 1000 MCG tablet Take 1,000 mcg by mouth daily.    . vitamin C (ASCORBIC ACID) 500 MG tablet Take 500 mg by mouth daily.       No current facility-administered medications for this visit.    No Known Allergies  Review of Systems negative except from HPI and PMH  Physical Exam BP 138/82 mmHg  Pulse 86  Ht 5\' 3"  (1.6 m)  Wt 184 lb 9.6 oz (83.734 kg)  BMI 32.71 kg/m2 Well developed and well nourished in no acute distress HENT normal E scleral and icterus clear Neck Supple JVP 7-8; carotids brisk and full Clear to ausculation  Regular rate and rhythm, 2/6 early systolic murmur  Soft with active bowel sounds No clubbing cyanosis 2+ Edema Alert and oriented, grossly normal motor and sensory function Skin Warm and Dry  ECG demonstrates sinus rhythm at 86 with P synchronous pacing    Assessment and  Plan  Complete heart block  Pacemaker-Medtronic  Congestive heart failure question mechanism  Hypertension  Blood pressure is reasonably controlled. There is significant fluid overload and likely contributing to his dyspnea on exertion. We'll begin him on low-dose diuretic and 20 mg furosemide  every other day for 2 weeks. I have told him that his target weight loss is 5-8 pounds.  We will give him a prescription so as to allow his metabolic profile to be drawn when he supposed to get blood work drawn for his liver issues in a couple of weeks   We will also obtain a 2-D echo to look at left ventricular function

## 2014-10-09 NOTE — Patient Instructions (Addendum)
Your physician has recommended you make the following change in your medication:  1) DECREASE Aspirin to 81 mg daily 2) START Furosemide 20 mg -- take one tablet every other day for 2 weeks  Please have BMET drawn (handwritten prescription given to you)   Your physician has requested that you have an echocardiogram. Echocardiography is a painless test that uses sound waves to create images of your heart. It provides your doctor with information about the size and shape of your heart and how well your heart's chambers and valves are working. This procedure takes approximately one hour. There are no restrictions for this procedure.  Remote monitoring is used to monitor your Pacemaker of ICD from home. This monitoring reduces the number of office visits required to check your device to one time per year. It allows [place] to keep an eye on the functioning of your device to ensure it is working properly. You are scheduled for a device check from home on 01/11/15. You may send your transmission at any time that day. If you have a wireless device, the transmission will be sent automatically. After your physician reviews your transmission, you will receive a postcard with your next transmission date.  Your physician wants you to follow-up in: 1 year with Dr. Caryl Comes.  You will receive a reminder letter in the mail two months in advance. If you don't receive a letter, please call our office to schedule the follow-up appointment.

## 2014-10-16 ENCOUNTER — Encounter: Payer: Self-pay | Admitting: Internal Medicine

## 2014-10-16 DIAGNOSIS — K5732 Diverticulitis of large intestine without perforation or abscess without bleeding: Secondary | ICD-10-CM | POA: Diagnosis not present

## 2014-10-16 DIAGNOSIS — Z79899 Other long term (current) drug therapy: Secondary | ICD-10-CM | POA: Diagnosis not present

## 2014-10-20 ENCOUNTER — Encounter: Payer: Medicare Other | Admitting: Internal Medicine

## 2014-10-22 ENCOUNTER — Ambulatory Visit (HOSPITAL_COMMUNITY): Payer: Medicare Other | Attending: Cardiology | Admitting: Radiology

## 2014-10-22 DIAGNOSIS — I442 Atrioventricular block, complete: Secondary | ICD-10-CM | POA: Diagnosis not present

## 2014-10-22 NOTE — Progress Notes (Signed)
Echocardiogram performed.  

## 2014-10-26 ENCOUNTER — Other Ambulatory Visit: Payer: Self-pay | Admitting: Internal Medicine

## 2014-11-04 ENCOUNTER — Telehealth: Payer: Self-pay

## 2014-11-04 NOTE — Telephone Encounter (Signed)
Pt made aware of results no questions at this time.  Reviewed with pt Lasix as pt has c/o that medication has not helped with weight. Pt has been off of Lasix for little over a week and has not had any changes in weight while on the medication and since being off.  Pt stated he still occasionally has SOB with activity.  Pt would like to know how to proceed. Pt weight yesterday was 181 and at one point got down to 175 but he stated that is with just his shorts on if he has cloths on, like when he is in the office, it is 184.4 lbs.

## 2014-11-04 NOTE — Telephone Encounter (Signed)
-----   Message from Deboraha Sprang, MD sent at 11/03/2014  7:51 AM EDT ----- Please Inform Patient that echo essentially  Normal--this is great news   Thanks

## 2014-11-11 NOTE — Telephone Encounter (Signed)
Informed patient to increase Lasix 40 mg QOD for 2 weeks. He will follow up at PCP office (Dr. Lin Landsman) in [place] in couple weeks for BMET.  I will fax order for this. Patient instructed to call me at end of 2 weeks and let us know progress.  He is agreeable and verbalized understanding of instructions.

## 2014-11-11 NOTE — Telephone Encounter (Signed)
lmtcb  (when patient calls back inform him:  Take Lasix 40 mg every other day for 2 weeks and increase potassium rich food intake during this time.  Will need to schedule f/u BMET after he completes his 2 week Lasix)

## 2014-11-12 ENCOUNTER — Other Ambulatory Visit: Payer: Self-pay | Admitting: Internal Medicine

## 2014-11-12 NOTE — Telephone Encounter (Signed)
Sent wrong amt in -- advised patient on 4/27 to take 40 mg QOD for 2 weeks per Caryl Comes. Will fix this and send updated rx with correct tablet number.

## 2014-11-12 NOTE — Telephone Encounter (Signed)
Just wanted to clarify with you. Patient should no longer be taking this? Please advise. Thanks, MI

## 2014-11-17 ENCOUNTER — Telehealth: Payer: Self-pay | Admitting: Internal Medicine

## 2014-11-17 NOTE — Telephone Encounter (Signed)
Informed patient I cannot find documentation of anyone in our office calling him yesterday. Explained that this may be from last week.  Patient agreed.

## 2014-11-17 NOTE — Telephone Encounter (Signed)
New message         Pt returning nurse call per pt

## 2014-12-01 ENCOUNTER — Telehealth: Payer: Self-pay | Admitting: Internal Medicine

## 2014-12-01 MED ORDER — FUROSEMIDE 20 MG PO TABS: 40.0000 mg | ORAL_TABLET | ORAL | Status: DC

## 2014-12-01 NOTE — Telephone Encounter (Signed)
New message     Talk to Scott [name] to give update and to be scheduled for a blood test

## 2014-12-01 NOTE — Telephone Encounter (Signed)
Patient states that he has seen improvement with taking  Lasix QOD.  Patient advised that we will continue this as it is "working".   He will obtain lab work on Thursday at Dr. Janace Aris office. I will follow up with this by next week if have not received the labs. Patient verbalized understanding and agreeable to plan.

## 2014-12-03 ENCOUNTER — Telehealth: Payer: Self-pay | Admitting: Internal Medicine

## 2014-12-03 DIAGNOSIS — K5732 Diverticulitis of large intestine without perforation or abscess without bleeding: Secondary | ICD-10-CM | POA: Diagnosis not present

## 2014-12-03 DIAGNOSIS — Z79899 Other long term (current) drug therapy: Secondary | ICD-10-CM | POA: Diagnosis not present

## 2014-12-03 NOTE — Telephone Encounter (Signed)
New message       Returning Scott [name]'s call.  Pt did have lab work today----ok to wait until tomorrow to call so that you will have the results

## 2014-12-08 ENCOUNTER — Encounter: Payer: Self-pay | Admitting: Internal Medicine

## 2014-12-11 NOTE — Telephone Encounter (Signed)
Lm for Dr Redding's nurse to call me back about lab work last week. Called patient and informed him that I am waiting on results and will call him once I have them. Patient verbalized understanding and agreeable to plan.

## 2014-12-17 NOTE — Telephone Encounter (Signed)
Received lab results.  Will review with Dr. Caryl Comes next week when he returns to office

## 2015-01-11 ENCOUNTER — Telehealth: Payer: Self-pay | Admitting: Cardiology

## 2015-01-11 ENCOUNTER — Ambulatory Visit (INDEPENDENT_AMBULATORY_CARE_PROVIDER_SITE_OTHER): Payer: Medicare Other | Admitting: *Deleted

## 2015-01-11 DIAGNOSIS — I442 Atrioventricular block, complete: Secondary | ICD-10-CM | POA: Diagnosis not present

## 2015-01-11 LAB — CUP PACEART REMOTE DEVICE CHECK
Battery Remaining Longevity: 97 mo
Brady Statistic AS VP Percent: 78 %
Brady Statistic AS VS Percent: 0 %
Date Time Interrogation Session: 20160627151044
Lead Channel Impedance Value: 451 Ohm
Lead Channel Pacing Threshold Amplitude: 0.375 V
Lead Channel Pacing Threshold Amplitude: 0.5 V
Lead Channel Pacing Threshold Pulse Width: 0.4 ms
Lead Channel Pacing Threshold Pulse Width: 0.4 ms
Lead Channel Sensing Intrinsic Amplitude: 1.4 mV
Lead Channel Setting Pacing Amplitude: 1.5 V
Lead Channel Setting Pacing Amplitude: 2.5 V
MDC IDC MSMT BATTERY IMPEDANCE: 393 Ohm
MDC IDC MSMT BATTERY VOLTAGE: 2.79 V
MDC IDC MSMT LEADCHNL RV IMPEDANCE VALUE: 795 Ohm
MDC IDC SET LEADCHNL RV PACING PULSEWIDTH: 0.4 ms
MDC IDC SET LEADCHNL RV SENSING SENSITIVITY: 5.6 mV
MDC IDC STAT BRADY AP VP PERCENT: 22 %
MDC IDC STAT BRADY AP VS PERCENT: 0 %

## 2015-01-11 NOTE — Telephone Encounter (Signed)
Spoke with pt and reminded pt of remote transmission that is due today. Pt verbalized understanding.   

## 2015-01-11 NOTE — Progress Notes (Signed)
Remote pacemaker transmission.   

## 2015-01-21 ENCOUNTER — Telehealth: Payer: Self-pay | Admitting: Internal Medicine

## 2015-01-21 NOTE — Telephone Encounter (Signed)
New message   Pt c/o swelling: STAT is pt has developed SOB within 24 hours  1. How long have you been experiencing swelling? "it varies" he has the fluid loss but sometimes it doesn't work.  2. Where is the swelling located? Legs   3.  Are you currently taking a "fluid pill"?yes   4.  Are you currently SOB? No   5.  Have you traveled recently?no

## 2015-01-21 NOTE — Telephone Encounter (Signed)
Patient describes BLEE, pitting 1+. He did lose 5 pds, but has gained it back last few days. States that once out of 6 days does he see a difference in urination.   Will review with Dr. Caryl Comes and the call him back -- pt is agreeable to this plan.

## 2015-01-22 NOTE — Telephone Encounter (Addendum)
Left detailed message to increase Furosemide to daily.  Advised I will call him Monday to check in on him and make sure message received.

## 2015-01-26 MED ORDER — FUROSEMIDE 20 MG PO TABS
40.0000 mg | ORAL_TABLET | Freq: Every day | ORAL | Status: DC
Start: 2015-01-26 — End: 2015-03-15

## 2015-01-26 NOTE — Telephone Encounter (Signed)
Patient states he received phone call Friday, but didn't hear entire message.  Instructed patient to increase Furosemide to daily.  Updated rx sent to [address] per pt request. Will send order to Dr. Lin Landsman, PCP, for follow up lab work in a few weeks. Patient verbalized understanding and agreeable to plan.

## 2015-02-15 DIAGNOSIS — Z79899 Other long term (current) drug therapy: Secondary | ICD-10-CM | POA: Diagnosis not present

## 2015-02-17 ENCOUNTER — Encounter: Payer: Self-pay | Admitting: Internal Medicine

## 2015-02-19 ENCOUNTER — Encounter: Payer: Self-pay | Admitting: Cardiology

## 2015-02-26 ENCOUNTER — Encounter: Payer: Self-pay | Admitting: Cardiology

## 2015-03-10 ENCOUNTER — Telehealth: Payer: Self-pay | Admitting: *Deleted

## 2015-03-10 NOTE — Telephone Encounter (Signed)
Telephone triage form filled out by operator Shelby Mattocks at 3:35 pm (due to epic being down) and handed to triage in regards to the pt calling to ask Dr Caryl Comes about having problems with his legs and does he need to go back to his PCP for this.  Attempted to call the pt x 2 and no answer.  Left message for the pt to call back tomorrow and ask to speak to a triage nurse.  Will defer this message to triage desktop for further follow-up.

## 2015-03-11 NOTE — Telephone Encounter (Signed)
Patient calling returning call from triage yesterday. Patient has slight swelling in BLE. Patient stated he was on Lasix for 2 weeks, but he does not feel that this helped as much as it should. Informed patient that Dr. Caryl Comes and Venida Jarvis, his nurse are out of the office this week. He stated he would wait until Dr. Caryl Comes was back, that his swelling is really not bad at all. Encouraged patient to elevate his BLE while sitting and reduce salt intake. Informed patient that it would not hurt to talk to his PCP about this also. Patient preferrs that Dr. Caryl Comes advise him on this. Will forward to Dr. Caryl Comes and Venida Jarvis.

## 2015-03-14 NOTE — Telephone Encounter (Signed)
Lets try increasing his furosemide from 20 >>40 qod for one week and see if this helps

## 2015-03-15 MED ORDER — FUROSEMIDE 40 MG PO TABS: 40.0000 mg | ORAL_TABLET | ORAL | Status: DC

## 2015-03-15 NOTE — Addendum Note (Signed)
Addended by: Dalphine Handing on: 03/15/2015 08:32 AM   Modules accepted: Orders

## 2015-03-15 NOTE — Telephone Encounter (Signed)
Called patient about Dr. Olin Pia response. Sent order to pharmacy of patient's choice. Patient will call back in a week to see if Lasix helped. Patient verbalized understanding.

## 2015-04-12 ENCOUNTER — Telehealth: Payer: Self-pay | Admitting: Cardiology

## 2015-04-12 ENCOUNTER — Ambulatory Visit (INDEPENDENT_AMBULATORY_CARE_PROVIDER_SITE_OTHER): Payer: Medicare Other | Admitting: *Deleted

## 2015-04-12 DIAGNOSIS — I442 Atrioventricular block, complete: Secondary | ICD-10-CM | POA: Diagnosis not present

## 2015-04-12 NOTE — Telephone Encounter (Signed)
LMOVM reminding pt to send remote transmission.   

## 2015-04-13 LAB — CUP PACEART REMOTE DEVICE CHECK
Battery Impedance: 442 Ohm
Brady Statistic AP VP Percent: 26 %
Brady Statistic AP VS Percent: 0 %
Brady Statistic AS VP Percent: 74 %
Brady Statistic AS VS Percent: 0 %
Lead Channel Impedance Value: 438 Ohm
Lead Channel Impedance Value: 750 Ohm
Lead Channel Pacing Threshold Amplitude: 0.25 V
Lead Channel Pacing Threshold Amplitude: 0.625 V
Lead Channel Pacing Threshold Pulse Width: 0.4 ms
Lead Channel Sensing Intrinsic Amplitude: 1.4 mV
Lead Channel Setting Pacing Amplitude: 1.5 V
Lead Channel Setting Pacing Pulse Width: 0.4 ms
MDC IDC MSMT BATTERY REMAINING LONGEVITY: 92 mo
MDC IDC MSMT BATTERY VOLTAGE: 2.79 V
MDC IDC MSMT LEADCHNL RV PACING THRESHOLD PULSEWIDTH: 0.4 ms
MDC IDC SESS DTM: 20160926162443
MDC IDC SET LEADCHNL RV PACING AMPLITUDE: 2.5 V
MDC IDC SET LEADCHNL RV SENSING SENSITIVITY: 5.6 mV

## 2015-04-13 NOTE — Progress Notes (Signed)
Remote pacemaker transmission.   

## 2015-04-16 ENCOUNTER — Encounter: Payer: Self-pay | Admitting: Cardiology

## 2015-04-19 DIAGNOSIS — R6889 Other general symptoms and signs: Secondary | ICD-10-CM | POA: Diagnosis not present

## 2015-04-27 DIAGNOSIS — R6889 Other general symptoms and signs: Secondary | ICD-10-CM | POA: Diagnosis not present

## 2015-04-30 DIAGNOSIS — Z23 Encounter for immunization: Secondary | ICD-10-CM | POA: Diagnosis not present

## 2015-05-03 ENCOUNTER — Encounter: Payer: Self-pay | Admitting: Internal Medicine

## 2015-05-13 DIAGNOSIS — R6889 Other general symptoms and signs: Secondary | ICD-10-CM | POA: Diagnosis not present

## 2015-05-26 DIAGNOSIS — R6889 Other general symptoms and signs: Secondary | ICD-10-CM | POA: Diagnosis not present

## 2015-06-03 DIAGNOSIS — Z961 Presence of intraocular lens: Secondary | ICD-10-CM | POA: Diagnosis not present

## 2015-07-02 DIAGNOSIS — Z1389 Encounter for screening for other disorder: Secondary | ICD-10-CM | POA: Diagnosis not present

## 2015-07-02 DIAGNOSIS — Z9181 History of falling: Secondary | ICD-10-CM | POA: Diagnosis not present

## 2015-07-02 DIAGNOSIS — E559 Vitamin D deficiency, unspecified: Secondary | ICD-10-CM | POA: Diagnosis not present

## 2015-07-02 DIAGNOSIS — D509 Iron deficiency anemia, unspecified: Secondary | ICD-10-CM | POA: Diagnosis not present

## 2015-07-02 DIAGNOSIS — D519 Vitamin B12 deficiency anemia, unspecified: Secondary | ICD-10-CM | POA: Diagnosis not present

## 2015-07-02 DIAGNOSIS — Z79899 Other long term (current) drug therapy: Secondary | ICD-10-CM | POA: Diagnosis not present

## 2015-07-02 DIAGNOSIS — Z0000 Encounter for general adult medical examination without abnormal findings: Secondary | ICD-10-CM | POA: Diagnosis not present

## 2015-07-02 DIAGNOSIS — E785 Hyperlipidemia, unspecified: Secondary | ICD-10-CM | POA: Diagnosis not present

## 2015-07-13 ENCOUNTER — Ambulatory Visit (INDEPENDENT_AMBULATORY_CARE_PROVIDER_SITE_OTHER): Payer: Medicare Other | Admitting: *Deleted

## 2015-07-13 ENCOUNTER — Telehealth: Payer: Self-pay | Admitting: Internal Medicine

## 2015-07-13 ENCOUNTER — Telehealth: Payer: Self-pay | Admitting: Cardiology

## 2015-07-13 DIAGNOSIS — I442 Atrioventricular block, complete: Secondary | ICD-10-CM | POA: Diagnosis not present

## 2015-07-13 NOTE — Telephone Encounter (Signed)
Spoke w/ pt and informed me that his remote transmission was received.

## 2015-07-13 NOTE — Telephone Encounter (Signed)
Follow Up    4. Are you calling to see if we received your device transmission? yes    Pt called states that he is having a hard time sending the signal please call back to discuss.

## 2015-07-13 NOTE — Telephone Encounter (Signed)
LMOVM reminding pt to send remote transmission.   

## 2015-07-14 ENCOUNTER — Telehealth: Payer: Self-pay | Admitting: Cardiology

## 2015-07-14 NOTE — Telephone Encounter (Signed)
Opened in error

## 2015-07-14 NOTE — Progress Notes (Signed)
Remote pacemaker transmission.   

## 2015-07-15 ENCOUNTER — Encounter: Payer: Self-pay | Admitting: Cardiology

## 2015-07-15 LAB — CUP PACEART REMOTE DEVICE CHECK
Battery Impedance: 466 Ohm
Battery Remaining Longevity: 91 mo
Battery Voltage: 2.79 V
Brady Statistic AP VP Percent: 25 %
Brady Statistic AS VP Percent: 74 %
Date Time Interrogation Session: 20161227202114
Implantable Lead Implant Date: 20101014
Implantable Lead Location: 753859
Implantable Lead Location: 753860
Implantable Lead Model: 5076
Implantable Lead Model: 5076
Lead Channel Impedance Value: 475 Ohm
Lead Channel Pacing Threshold Amplitude: 0.5 V
Lead Channel Sensing Intrinsic Amplitude: 1.4 mV
Lead Channel Setting Pacing Amplitude: 2.5 V
Lead Channel Setting Pacing Pulse Width: 0.4 ms
MDC IDC LEAD IMPLANT DT: 20101014
MDC IDC MSMT LEADCHNL RA PACING THRESHOLD PULSEWIDTH: 0.4 ms
MDC IDC MSMT LEADCHNL RV IMPEDANCE VALUE: 790 Ohm
MDC IDC MSMT LEADCHNL RV PACING THRESHOLD AMPLITUDE: 0.375 V
MDC IDC MSMT LEADCHNL RV PACING THRESHOLD PULSEWIDTH: 0.4 ms
MDC IDC SET LEADCHNL RA PACING AMPLITUDE: 1.5 V
MDC IDC SET LEADCHNL RV SENSING SENSITIVITY: 5.6 mV
MDC IDC STAT BRADY AP VS PERCENT: 0 %
MDC IDC STAT BRADY AS VS PERCENT: 0 %

## 2015-08-18 ENCOUNTER — Encounter: Payer: Self-pay | Admitting: Family

## 2015-08-24 ENCOUNTER — Ambulatory Visit (HOSPITAL_COMMUNITY)
Admission: RE | Admit: 2015-08-24 | Discharge: 2015-08-24 | Disposition: A | Discharge status: Home / Self Care | Payer: Medicare Other | Source: Ambulatory Visit | Attending: Family | Admitting: Family

## 2015-08-24 ENCOUNTER — Ambulatory Visit (INDEPENDENT_AMBULATORY_CARE_PROVIDER_SITE_OTHER): Payer: Medicare Other | Admitting: Family

## 2015-08-24 ENCOUNTER — Encounter: Payer: Self-pay | Admitting: Family

## 2015-08-24 VITALS — BP 124/76 | HR 67 | Temp 96.7°F | Resp 14 | Ht 64.0 in | Wt 185.0 lb

## 2015-08-24 DIAGNOSIS — I6523 Occlusion and stenosis of bilateral carotid arteries: Secondary | ICD-10-CM | POA: Diagnosis not present

## 2015-08-24 DIAGNOSIS — I10 Essential (primary) hypertension: Secondary | ICD-10-CM | POA: Insufficient documentation

## 2015-08-24 DIAGNOSIS — E785 Hyperlipidemia, unspecified: Secondary | ICD-10-CM | POA: Diagnosis not present

## 2015-08-24 NOTE — Patient Instructions (Signed)
Stroke Prevention Some medical conditions and behaviors are associated with an increased chance of having a stroke. You may prevent a stroke by making healthy choices and managing medical conditions. HOW CAN I REDUCE MY RISK OF HAVING A STROKE?   Stay physically active. Get at least 30 minutes of activity on most or all days.  Do not smoke. It may also be helpful to avoid exposure to secondhand smoke.  Limit alcohol use. Moderate alcohol use is considered to be:  No more than 2 drinks per day for men.  No more than 1 drink per day for nonpregnant women.  Eat healthy foods. This involves:  Eating 5 or more servings of fruits and vegetables a day.  Making dietary changes that address high blood pressure (hypertension), high cholesterol, diabetes, or obesity.  Manage your cholesterol levels.  Making food choices that are high in fiber and low in saturated fat, trans fat, and cholesterol may control cholesterol levels.  Take any prescribed medicines to control cholesterol as directed by your health care provider.  Manage your diabetes.  Controlling your carbohydrate and sugar intake is recommended to manage diabetes.  Take any prescribed medicines to control diabetes as directed by your health care provider.  Control your hypertension.  Making food choices that are low in salt (sodium), saturated fat, trans fat, and cholesterol is recommended to manage hypertension.  Ask your health care provider if you need treatment to lower your blood pressure. Take any prescribed medicines to control hypertension as directed by your health care provider.  If you are 18-39 years of age, have your blood pressure checked every 3-5 years. If you are 40 years of age or older, have your blood pressure checked every year.  Maintain a healthy weight.  Reducing calorie intake and making food choices that are low in sodium, saturated fat, trans fat, and cholesterol are recommended to manage  weight.  Stop drug abuse.  Avoid taking birth control pills.  Talk to your health care provider about the risks of taking birth control pills if you are over 35 years old, smoke, get migraines, or have ever had a blood clot.  Get evaluated for sleep disorders (sleep apnea).  Talk to your health care provider about getting a sleep evaluation if you snore a lot or have excessive sleepiness.  Take medicines only as directed by your health care provider.  For some people, aspirin or blood thinners (anticoagulants) are helpful in reducing the risk of forming abnormal blood clots that can lead to stroke. If you have the irregular heart rhythm of atrial fibrillation, you should be on a blood thinner unless there is a good reason you cannot take them.  Understand all your medicine instructions.  Make sure that other conditions (such as anemia or atherosclerosis) are addressed. SEEK IMMEDIATE MEDICAL CARE IF:   You have sudden weakness or numbness of the face, arm, or leg, especially on one side of the body.  Your face or eyelid droops to one side.  You have sudden confusion.  You have trouble speaking (aphasia) or understanding.  You have sudden trouble seeing in one or both eyes.  You have sudden trouble walking.  You have dizziness.  You have a loss of balance or coordination.  You have a sudden, severe headache with no known cause.  You have new chest pain or an irregular heartbeat. Any of these symptoms may represent a serious problem that is an emergency. Do not wait to see if the symptoms will   go away. Get medical help at once. Call your local emergency services (911 in [place] Do not drive yourself to the hospital.   This information is not intended to replace advice given to you by your health care provider. Make sure you discuss any questions you have with your health care provider.   Document Released: 08/10/2004 Document Revised: 07/24/2014 Document Reviewed:  01/03/2013 Elsevier Interactive Patient Education 2016 [employer]  

## 2015-08-24 NOTE — Progress Notes (Signed)
Chief Complaint: Extracranial Carotid Artery Stenosis   History of Present Illness  Scott [name] is a 82 y.o. male patient of Dr. Donnetta Hutching who was seen on initial referral in February 2016 after a carotid duplex evaluation at [facility] on 07/03/2014. The pt denies any prior neurologic deficits.   The patient denies any history of TIA or stroke symptoms, specifically the patient denies a history of amaurosis fugax or monocular blindness, denies a history unilateral  of facial drooping, denies a history of hemiplegia, and denies a history of receptive or expressive aphasia.    He has a history dating back to approximately 2010 of coronary artery disease. He reports that he was told that time he had approximately 50% stenosis in his carotid arteries. He had a more recent issue of severe bradycardia and had pacemaker placement with marked improvement in his tiredness since that time. No recent cardiac difficulties.  The patient denies New Medical or Surgical History.  Pt Diabetic: "borderline" Pt smoker: non-smoker  Pt meds include: Statin : yes ASA: yes Other anticoagulants/antiplatelets: no   Past Medical History  Diagnosis Date  . Hyperlipidemia   . Hypertension   . Pacemaker   . Complete heart block ([place])   . B12 deficiency   . Pain in joint, pelvic region and thigh   . Carotid artery disease ([place])   . BPH (benign prostatic hyperplasia)   . PVD (peripheral vascular disease) ([place])     LICA 123XX123 - PVD    Social History Social History  Substance Use Topics  . Smoking status: Never Smoker   . Smokeless tobacco: Never Used  . Alcohol Use: No    Family History History reviewed. No pertinent family history.  Surgical History Past Surgical History  Procedure Laterality Date  . Pacemaker insertion  04/29/09    Medtronic Adapta L implanted by Dr Caryl Comes for AV block  . Cholecystectomy    . Cardiac catheterization    . Coronary angioplasty with stent placement   11/12/08    No angiographically significant CAD - mid to distal LAD small in caliber, question a degree of vasospasm. LVEDP-25mmHg, aoritc valve stenosis very mild 9.23mmHg.    No Known Allergies  Current Outpatient Prescriptions  Medication Sig Dispense Refill  . aspirin EC 81 MG tablet Take 1 tablet (81 mg total) by mouth daily. 30 tablet 11  . Calcium Carbonate-Vitamin D (CALCIUM 500 + D PO) Take 1 capsule by mouth daily.     . Cholecalciferol (VITAMIN D3) 1000 UNITS CAPS Take 1,000 Units by mouth daily.    . ciclopirox (LOPROX) 0.77 % cream as needed.  1  . furosemide (LASIX) 40 MG tablet Take 1 tablet (40 mg total) by mouth every other day. 4 tablet 0  . gabapentin (NEURONTIN) 300 MG capsule Take 300 mg by mouth 3 (three) times daily.      . Garlic 123XX123 MG CAPS Take 1,000 mg by mouth daily.    . Glucosamine-Chondroitin-MSM (TRIPLE FLEX PO) Take 1 tablet by mouth 3 (three) times daily.    Marland Kitchen lisinopril (PRINIVIL,ZESTRIL) 10 MG tablet Take 10 mg by mouth daily.    . metoprolol succinate (TOPROL-XL) 50 MG 24 hr tablet Take 1 tablet by mouth daily.    . pravastatin (PRAVACHOL) 20 MG tablet Take 20 mg by mouth at bedtime.  2  . silodosin (RAPAFLO) 8 MG CAPS capsule Take 8 mg by mouth daily with breakfast.      . vitamin B-12 (CYANOCOBALAMIN) 1000  MCG tablet Take 1,000 mcg by mouth daily.    . vitamin C (ASCORBIC ACID) 500 MG tablet Take 500 mg by mouth daily.       No current facility-administered medications for this visit.    Review of Systems : See HPI for pertinent positives and negatives.  Physical Examination  Filed Vitals:   08/24/15 1303 08/24/15 1306  BP: 135/80 124/76  Pulse: 68 67  Temp:  96.7 F (35.9 C)  TempSrc:  Oral  Resp:  14  Height:  5\' 4"  (1.626 m)  Weight:  185 lb (83.915 kg)  SpO2:  94%   Body mass index is 31.74 kg/(m^2).  General: WDWN obese male in NAD GAIT: normal Eyes: PERRLA Pulmonary:  Non-labored, CTAB, no rales,  rhonchi, or  wheezing.  Cardiac: regular rhythm,  no detected murmur. Pacemaker palpated left side of chest.  VASCULAR EXAM Carotid Bruits Right Left   Negative Negative    Aorta is not palpable. Radial pulses are 2+ palpable and equal.                                                                                                                            LE Pulses Right Left       POPLITEAL  not palpable   not palpable       POSTERIOR TIBIAL  faintly palpable   faintly palpable        DORSALIS PEDIS      ANTERIOR TIBIAL not palpable  not palpable     Gastrointestinal: soft, nontender, BS WNL, no r/g,  no palpable masses.  Musculoskeletal: no muscle atrophy/wasting. M/S 5/5 throughout, extremities without ischemic changes.  Neurologic: A&O X 3; Appropriate Affect, Speech is normal CN 2-12 intact, pain and light touch intact in extremities, Motor exam as listed above.   Non-Invasive Vascular Imaging CAROTID DUPLEX 08/24/2015   CEREBROVASCULAR DUPLEX EVALUATION    INDICATION: Carotid artery disease     PREVIOUS INTERVENTION(S):     DUPLEX EXAM:     RIGHT  LEFT  Peak Systolic Velocities (cm/s) End Diastolic Velocities (cm/s) Plaque LOCATION Peak Systolic Velocities (cm/s) End Diastolic Velocities (cm/s) Plaque  87 15  CCA PROXIMAL 101 23   88 16  CCA MID 90 24 HT  75 16 HM CCA DISTAL 79 21   131 retrograde  HM ECA 406 43 HT  165 bifurcation 122 proximal 31 25 HM ICA PROXIMAL 134 40 HM  113 20  ICA MID 84 28   102 33  ICA DISTAL 81 30     1.87 ICA / CCA Ratio (PSV) 1.48  Antegrade  Vertebral Flow Antegrade    Brachial Systolic Pressure (mmHg)   Multiphasic (Subclavian artery) Brachial Artery Waveforms Multiphasic (Subclavian artery)    Plaque Morphology:  HM = Homogeneous, HT = Heterogeneous, CP = Calcific Plaque, SP = Smooth Plaque, IP = Irregular Plaque     ADDITIONAL FINDINGS:     IMPRESSION: Right internal  carotid artery velocities suggest a <40% stenosis (high end  of range).  Left internal carotid artery velocities suggest a 40-59% stenosis.     Compared to the previous exam:  No prior exam performed at this facility for comparison.       Assessment: Scott [name] is a 82 y.o. male who has no hx of stroke or TIA. Today's carotid duplex suggests <40% right ICA stenosis and 40-59% left ICA stenosis.   He is still working at age 18. His atherosclerotic risk factors include obesity and borderline DM. Fortunately he has never used tobacco.    Plan: Follow-up in 1 year with Carotid Duplex scan.   I discussed in depth with the patient the nature of atherosclerosis, and emphasized the importance of maximal medical management including strict control of blood pressure, blood glucose, and lipid levels, obtaining regular exercise, and continued cessation of smoking.  The patient is aware that without maximal medical management the underlying atherosclerotic disease process will progress, limiting the benefit of any interventions. The patient was given information about stroke prevention and what symptoms should prompt the patient to seek immediate medical care. Thank you for allowing [place] to participate in this patient's care.  Clemon Chambers, RN, MSN, FNP-C [facility] [place] Office: [phone]  Clinic Physician: Early  08/24/2015 1:19 PM

## 2015-10-29 ENCOUNTER — Ambulatory Visit (INDEPENDENT_AMBULATORY_CARE_PROVIDER_SITE_OTHER): Payer: Medicare Other | Admitting: Internal Medicine

## 2015-10-29 ENCOUNTER — Encounter: Payer: Self-pay | Admitting: Internal Medicine

## 2015-10-29 VITALS — BP 124/62 | HR 71 | Ht 64.0 in | Wt 179.6 lb

## 2015-10-29 DIAGNOSIS — I442 Atrioventricular block, complete: Secondary | ICD-10-CM

## 2015-10-29 DIAGNOSIS — Z950 Presence of cardiac pacemaker: Secondary | ICD-10-CM | POA: Diagnosis not present

## 2015-10-29 DIAGNOSIS — I509 Heart failure, unspecified: Secondary | ICD-10-CM | POA: Diagnosis not present

## 2015-10-29 NOTE — Progress Notes (Signed)
Patient Care Team: Angelina Sheriff, MD as PCP - General (Unknown Physician Specialty)   HPI  Scott [name] is a 82 y.o. male Seen in follow-up for pacemaker implanted many years ago. He has complete heart block.   His major complaints or exercise intolerance. This is manifested by fatigue and some shortness of breath.  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 His wife has been getting weaker and he is doing more at home.  He has had problems with peripheral edema and some dyspnea on exertion. He has had no syncope. He denies chest pain. Remote catheterization demonstrated no obstructive coronary disease (2010)  Echo 4/ 2016 normal LV funciton  Past Medical History  Diagnosis Date  . Hyperlipidemia   . Hypertension   . Pacemaker   . Complete heart block ([place])   . B12 deficiency   . Pain in joint, pelvic region and thigh   . Carotid artery disease ([place])   . BPH (benign prostatic hyperplasia)   . PVD (peripheral vascular disease) ([facility])     LICA 123XX123 - PVD    Past Surgical History  Procedure Laterality Date  . Pacemaker insertion  04/29/09    Medtronic Adapta L implanted by Dr Caryl Comes for AV block  . Cholecystectomy    . Cardiac catheterization    . Coronary angioplasty with stent placement  11/12/08    No angiographically significant CAD - mid to distal LAD small in caliber, question a degree of vasospasm. LVEDP-84mmHg, aoritc valve stenosis very mild 9.30mmHg.    Current Outpatient Prescriptions  Medication Sig Dispense Refill  . aspirin EC 81 MG tablet Take 1 tablet (81 mg total) by mouth daily. 30 tablet 11  . Calcium Carbonate-Vitamin D (CALCIUM 500 + D PO) Take 1 capsule by mouth daily.     . Cholecalciferol (VITAMIN D3) 1000 UNITS CAPS Take 1,000 Units by mouth daily.    . ciclopirox (LOPROX) 0.77 % cream as needed.  1  . gabapentin (NEURONTIN) 300 MG capsule Take 300 mg by mouth 3 (three) times daily.        . Glucosamine-Chondroitin-MSM (TRIPLE FLEX PO) Take 1 tablet by mouth 3 (three) times daily.    Marland Kitchen lisinopril (PRINIVIL,ZESTRIL) 10 MG tablet Take 10 mg by mouth daily.    . metoprolol succinate (TOPROL-XL) 50 MG 24 hr tablet Take 1 tablet by mouth daily.    . pravastatin (PRAVACHOL) 20 MG tablet Take 20 mg by mouth at bedtime.  2  . silodosin (RAPAFLO) 8 MG CAPS capsule Take 8 mg by mouth daily with breakfast.      . vitamin C (ASCORBIC ACID) 500 MG tablet Take 500 mg by mouth daily.       No current facility-administered medications for this visit.    No Known Allergies  Review of Systems negative except from HPI and PMH  Physical Exam BP 124/62 mmHg  Pulse 71  Ht 5\' 4"  (1.626 m)  Wt 179 lb 9.6 oz (81.466 kg)  BMI 30.81 kg/m2 Well developed and well nourished in no acute distress HENT normal E scleral and icterus clear Neck Supple JVP 6-7; carotids brisk and full Clear to ausculation  Regular rate and rhythm, 2/6 early systolic murmur  Soft with active bowel sounds No clubbing cyanosis  no Edema Alert and oriented, grossly normal motor and sensory function Skin Warm and Dry  ECG demonstrates sinus rhythm  with P synchronous pacing    very broad QRS of 200 ms   Assessment and  Plan  Complete heart block  Pacemaker-Medtronic  Hypertension   Blood pressure is well-controlled. Pacemaker function is normal with reasonable heart rate excursion. There is no evidence of volume overload as there was last year. I am not sure that there is anything cardiovascularly we can do to improve his exercise tolerance.

## 2015-10-29 NOTE — Patient Instructions (Signed)
Medication Instructions: - Your physician recommends that you continue on your current medications as directed. Please refer to the Current Medication list given to you today.  Labwork: - none today  Procedures/Testing: - none  Follow-Up: - Remote monitoring is used to monitor your Pacemaker of ICD from home. This monitoring reduces the number of office visits required to check your device to one time per year. It allows [place] to keep an eye on the functioning of your device to ensure it is working properly. You are scheduled for a device check from home on 01/31/16. You may send your transmission at any time that day. If you have a wireless device, the transmission will be sent automatically. After your physician reviews your transmission, you will receive a postcard with your next transmission date.  - Your physician wants you to follow-up in: 1 year with Dr. Caryl Comes. You will receive a reminder letter in the mail two months in advance. If you don't receive a letter, please call our office to schedule the follow-up appointment.  Any Additional Special Instructions Will Be Listed Below (If Applicable).     If you need a refill on your cardiac medications before your next appointment, please call your pharmacy.

## 2016-01-25 DIAGNOSIS — B308 Other viral conjunctivitis: Secondary | ICD-10-CM | POA: Diagnosis not present

## 2016-01-31 ENCOUNTER — Ambulatory Visit (INDEPENDENT_AMBULATORY_CARE_PROVIDER_SITE_OTHER): Payer: Medicare Other | Admitting: *Deleted

## 2016-01-31 ENCOUNTER — Telehealth: Payer: Self-pay | Admitting: Cardiology

## 2016-01-31 DIAGNOSIS — I442 Atrioventricular block, complete: Secondary | ICD-10-CM

## 2016-01-31 NOTE — Telephone Encounter (Signed)
Spoke with pt and reminded pt of remote transmission that is due today. Pt verbalized understanding.   

## 2016-02-01 ENCOUNTER — Telehealth: Payer: Self-pay | Admitting: Internal Medicine

## 2016-02-01 LAB — CUP PACEART REMOTE DEVICE CHECK
Battery Remaining Longevity: 84 mo
Battery Voltage: 2.79 V
Brady Statistic AP VP Percent: 33.9 %
Brady Statistic AP VS Percent: 0.1 %
Brady Statistic AS VP Percent: 66.1 %
Brady Statistic AS VS Percent: 0.1 %
Date Time Interrogation Session: 20170718161915
Implantable Lead Implant Date: 20101014
Implantable Lead Implant Date: 20101014
Implantable Lead Location: 753859
Implantable Lead Location: 753860
Implantable Lead Model: 5076
Implantable Lead Model: 5076
Lead Channel Impedance Value: 470 Ohm
Lead Channel Impedance Value: 807 Ohm
Lead Channel Pacing Threshold Amplitude: 0.5 V
Lead Channel Pacing Threshold Amplitude: 0.625 V
Lead Channel Pacing Threshold Pulse Width: 0.4 ms
Lead Channel Pacing Threshold Pulse Width: 0.4 ms
Lead Channel Sensing Intrinsic Amplitude: 2.8 mV
Lead Channel Setting Pacing Amplitude: 1.5 V
Lead Channel Setting Pacing Amplitude: 2.5 V
Lead Channel Setting Pacing Pulse Width: 0.4 ms
Lead Channel Setting Sensing Sensitivity: 5.6 mV

## 2016-02-01 NOTE — Telephone Encounter (Signed)
New message       1. Has your device fired? no  2. Is you device beeping? no  3. Are you experiencing draining or swelling at device site? no  4. Are you calling to see if we received your device transmission? The pt is needing help with the signal down load, the pt states the call failed to be completed  5. Have you passed out? no

## 2016-02-01 NOTE — Telephone Encounter (Signed)
Returned pt call and was able to assist patient in troubleshooting sending a remote transmission. Informed patient that we have received transmission and gave patient clinic number for any further assistance.

## 2016-02-01 NOTE — Progress Notes (Signed)
Remote pacemaker transmission.   

## 2016-02-02 ENCOUNTER — Encounter: Payer: Self-pay | Admitting: Cardiology

## 2016-03-31 DIAGNOSIS — Z23 Encounter for immunization: Secondary | ICD-10-CM | POA: Diagnosis not present

## 2016-04-07 DIAGNOSIS — G629 Polyneuropathy, unspecified: Secondary | ICD-10-CM | POA: Diagnosis not present

## 2016-05-02 ENCOUNTER — Telehealth: Payer: Self-pay | Admitting: Cardiology

## 2016-05-02 ENCOUNTER — Ambulatory Visit (INDEPENDENT_AMBULATORY_CARE_PROVIDER_SITE_OTHER): Payer: Medicare Other | Admitting: *Deleted

## 2016-05-02 DIAGNOSIS — I442 Atrioventricular block, complete: Secondary | ICD-10-CM

## 2016-05-02 NOTE — Telephone Encounter (Signed)
Spoke with pt and reminded pt of remote transmission that is due today. Pt verbalized understanding.   

## 2016-05-03 ENCOUNTER — Encounter: Payer: Self-pay | Admitting: Cardiology

## 2016-05-03 NOTE — Progress Notes (Signed)
Remote pacemaker transmission.   

## 2016-05-12 LAB — CUP PACEART REMOTE DEVICE CHECK
Battery Impedance: 638 Ohm
Battery Voltage: 2.79 V
Brady Statistic AP VP Percent: 32 %
Brady Statistic AP VS Percent: 0 %
Brady Statistic AS VP Percent: 68 %
Date Time Interrogation Session: 20171017212355
Implantable Lead Implant Date: 20101014
Implantable Lead Location: 753860
Implantable Lead Model: 5076
Implantable Lead Model: 5076
Lead Channel Impedance Value: 450 Ohm
Lead Channel Pacing Threshold Amplitude: 0.5 V
Lead Channel Pacing Threshold Pulse Width: 0.4 ms
Lead Channel Pacing Threshold Pulse Width: 0.4 ms
Lead Channel Sensing Intrinsic Amplitude: 1.4 mV
Lead Channel Setting Pacing Amplitude: 2.5 V
Lead Channel Setting Sensing Sensitivity: 5.6 mV
MDC IDC LEAD IMPLANT DT: 20101014
MDC IDC LEAD LOCATION: 753859
MDC IDC MSMT BATTERY REMAINING LONGEVITY: 78 mo
MDC IDC MSMT LEADCHNL RV IMPEDANCE VALUE: 805 Ohm
MDC IDC MSMT LEADCHNL RV PACING THRESHOLD AMPLITUDE: 0.5 V
MDC IDC SET LEADCHNL RA PACING AMPLITUDE: 1.5 V
MDC IDC SET LEADCHNL RV PACING PULSEWIDTH: 0.4 ms
MDC IDC STAT BRADY AS VS PERCENT: 0 %

## 2016-07-20 DIAGNOSIS — H04123 Dry eye syndrome of bilateral lacrimal glands: Secondary | ICD-10-CM | POA: Diagnosis not present

## 2016-07-25 DIAGNOSIS — Z9181 History of falling: Secondary | ICD-10-CM | POA: Diagnosis not present

## 2016-07-25 DIAGNOSIS — Z1389 Encounter for screening for other disorder: Secondary | ICD-10-CM | POA: Diagnosis not present

## 2016-07-25 DIAGNOSIS — D519 Vitamin B12 deficiency anemia, unspecified: Secondary | ICD-10-CM | POA: Diagnosis not present

## 2016-07-25 DIAGNOSIS — Z79899 Other long term (current) drug therapy: Secondary | ICD-10-CM | POA: Diagnosis not present

## 2016-07-25 DIAGNOSIS — Z0000 Encounter for general adult medical examination without abnormal findings: Secondary | ICD-10-CM | POA: Diagnosis not present

## 2016-07-25 DIAGNOSIS — E785 Hyperlipidemia, unspecified: Secondary | ICD-10-CM | POA: Diagnosis not present

## 2016-08-01 ENCOUNTER — Ambulatory Visit (INDEPENDENT_AMBULATORY_CARE_PROVIDER_SITE_OTHER): Payer: Medicare Other | Admitting: *Deleted

## 2016-08-01 ENCOUNTER — Telehealth: Payer: Self-pay | Admitting: Cardiology

## 2016-08-01 DIAGNOSIS — I442 Atrioventricular block, complete: Secondary | ICD-10-CM

## 2016-08-01 NOTE — Telephone Encounter (Signed)
Spoke with pt and reminded pt of remote transmission that is due today. Pt verbalized understanding.   

## 2016-08-04 NOTE — Progress Notes (Signed)
Remote pacemaker transmission.   

## 2016-08-07 LAB — CUP PACEART REMOTE DEVICE CHECK
Battery Impedance: 687 Ohm
Battery Remaining Longevity: 74 mo
Battery Voltage: 2.79 V
Brady Statistic AP VP Percent: 31 %
Brady Statistic AP VS Percent: 0 %
Brady Statistic AS VP Percent: 69 %
Date Time Interrogation Session: 20180116232721
Implantable Lead Implant Date: 20101014
Implantable Lead Location: 753860
Implantable Lead Model: 5076
Implantable Lead Model: 5076
Implantable Pulse Generator Implant Date: 20101014
Lead Channel Pacing Threshold Amplitude: 0.625 V
Lead Channel Pacing Threshold Pulse Width: 0.4 ms
Lead Channel Pacing Threshold Pulse Width: 0.4 ms
Lead Channel Sensing Intrinsic Amplitude: 1 mV
Lead Channel Setting Pacing Amplitude: 1.5 V
Lead Channel Setting Pacing Amplitude: 2.5 V
MDC IDC LEAD IMPLANT DT: 20101014
MDC IDC LEAD LOCATION: 753859
MDC IDC MSMT LEADCHNL RA IMPEDANCE VALUE: 409 Ohm
MDC IDC MSMT LEADCHNL RV IMPEDANCE VALUE: 734 Ohm
MDC IDC MSMT LEADCHNL RV PACING THRESHOLD AMPLITUDE: 0.375 V
MDC IDC SET LEADCHNL RV PACING PULSEWIDTH: 0.4 ms
MDC IDC SET LEADCHNL RV SENSING SENSITIVITY: 5.6 mV
MDC IDC STAT BRADY AS VS PERCENT: 0 %

## 2016-08-09 ENCOUNTER — Encounter: Payer: Self-pay | Admitting: Cardiology

## 2016-08-29 ENCOUNTER — Encounter (HOSPITAL_COMMUNITY): Payer: Medicare Other

## 2016-08-29 ENCOUNTER — Ambulatory Visit: Payer: Medicare Other | Admitting: Family

## 2016-09-04 DIAGNOSIS — L570 Actinic keratosis: Secondary | ICD-10-CM | POA: Diagnosis not present

## 2016-09-08 ENCOUNTER — Encounter: Payer: Self-pay | Admitting: Family

## 2016-09-14 ENCOUNTER — Encounter: Payer: Self-pay | Admitting: Family

## 2016-09-14 ENCOUNTER — Ambulatory Visit (INDEPENDENT_AMBULATORY_CARE_PROVIDER_SITE_OTHER): Payer: Medicare Other | Admitting: Family

## 2016-09-14 ENCOUNTER — Ambulatory Visit (HOSPITAL_COMMUNITY)
Admission: RE | Admit: 2016-09-14 | Discharge: 2016-09-14 | Disposition: A | Discharge status: Home / Self Care | Payer: Medicare Other | Source: Ambulatory Visit | Attending: Family | Admitting: Family

## 2016-09-14 VITALS — BP 134/68 | HR 65 | Temp 99.4°F | Resp 20 | Ht 63.0 in | Wt 174.0 lb

## 2016-09-14 DIAGNOSIS — I6523 Occlusion and stenosis of bilateral carotid arteries: Secondary | ICD-10-CM

## 2016-09-14 LAB — VAS US CAROTID
LCCADDIAS: -28 cm/s
LCCAPSYS: 113 cm/s
LEFT ECA DIAS: -8 cm/s
LEFT VERTEBRAL DIAS: 28 cm/s
LICADDIAS: -37 cm/s
Left CCA dist sys: -95 cm/s
Left CCA prox dias: 24 cm/s
Left ICA dist sys: -164 cm/s
Left ICA prox dias: 34 cm/s
Left ICA prox sys: 152 cm/s
RCCADSYS: -108 cm/s
RCCAPDIAS: 15 cm/s
RIGHT CCA MID DIAS: 16 cm/s
RIGHT ECA DIAS: 11 cm/s
RIGHT VERTEBRAL DIAS: -11 cm/s
Right CCA prox sys: 94 cm/s

## 2016-09-14 NOTE — Patient Instructions (Signed)
Preventing Cerebrovascular Disease Arteries are blood vessels that carry blood that contains oxygen from the heart to all parts of the body. Cerebrovascular disease affects arteries that supply the brain. Any condition that blocks or disrupts blood flow to the brain can cause cerebrovascular disease. Brain cells that lose blood supply start to die within minutes (stroke). Stroke is the main danger of cerebrovascular disease. Atherosclerosis and high blood pressure are common causes of cerebrovascular disease. Atherosclerosis is narrowing and hardening of an artery that results when fat, cholesterol, calcium, or other substances (plaque) build up inside an artery. Plaque reduces blood flow through the artery. High blood pressure increases the risk of bleeding inside the brain. Making diet and lifestyle changes to prevent atherosclerosis and high blood pressure lowers your risk of cerebrovascular disease. What nutrition changes can be made?  Eat more fruits, vegetables, and whole grains.  Reduce how much saturated fat you eat. To do this, eat less red meat and fewer full-fat dairy products.  Eat healthy proteins instead of red meat. Healthy proteins include:  Fish. Eat fish that contains heart-healthy omega-3 fatty acids, twice a week. Examples include salmon, albacore tuna, mackerel, and herring.  Chicken.  Nuts.  Low-fat or nonfat yogurt.  Avoid processed meats, like bacon and lunchmeat.  Avoid foods that contain:  A lot of sugar, such as sweets and drinks with added sugar.  A lot of salt (sodium). Avoid adding extra salt to your food, as told by your health care provider.  Trans fats, such as margarine and baked goods. Trans fats may be listed as "partially hydrogenated oils" on food labels.  Check food labels to see how much sodium, sugar, and trans fats are in foods.  Use vegetable oils that contain low amounts of saturated fat, such as olive oil or canola oil. What lifestyle  changes can be made?  Drink alcohol in moderation. This means no more than 1 drink a day for nonpregnant women and 2 drinks a day for men. One drink equals 12 oz of beer, 5 oz of wine, or 1 oz of hard liquor.  If you are overweight, ask your health care provider to recommend a weight-loss plan for you. Losing 5-10 lb (2.2-4.5 kg) can reduce your risk of diabetes, atherosclerosis, and high blood pressure.  Exercise for 30?60 minutes on most days, or as much as told by your health care provider.  Do moderate-intensity exercise, such as brisk walking, bicycling, and water aerobics. Ask your health care provider which activities are safe for you.  Do not use any products that contain nicotine or tobacco, such as cigarettes and e-cigarettes. If you need help quitting, ask your health care provider. Why are these changes important? Making these changes lowers your risk of many diseases that can cause cerebrovascular disease and stroke. Stroke is a leading cause of death and disability. Making these changes also improves your overall health and quality of life. What can I do to lower my risk? The following factors make you more likely to develop cerebrovascular disease:  Being overweight.  Smoking.  Being physically inactive.  Eating a high-fat diet.  Having certain health conditions, such as:  Diabetes.  High blood pressure.  Heart disease.  Atherosclerosis.  High cholesterol.  Sickle cell disease. Talk with your health care provider about your risk for cerebrovascular disease. Work with your health care provider to control diseases that you have that may contribute to cerebrovascular disease. Your health care provider may prescribe medicines to help prevent major   causes of cerebrovascular disease. Where to find more information: Learn more about preventing cerebrovascular disease from:  National Heart, Lung, and Blood Institute:  www.nhlbi.nih.gov/health/health-topics/topics/stroke  Centers for Disease Control and Prevention: cdc.gov/stroke/about.htm Summary  Cerebrovascular disease can lead to a stroke.  Atherosclerosis and high blood pressure are major causes of cerebrovascular disease.  Making diet and lifestyle changes can reduce your risk of cerebrovascular disease.  Work with your health care provider to get your risk factors under control to reduce your risk of cerebrovascular disease. This information is not intended to replace advice given to you by your health care provider. Make sure you discuss any questions you have with your health care provider. Document Released: 07/18/2015 Document Revised: 01/21/2016 Document Reviewed: 07/18/2015 Elsevier Interactive Patient Education  2017 Elsevier Inc.  

## 2016-09-14 NOTE — Progress Notes (Signed)
Chief Complaint: Follow up Extracranial Carotid Artery Stenosis   History of Present Illness  Scott [name] is a 83 y.o. male patient of Dr. Donnetta Hutching who was seen on initial referral in February 2016 after a carotid duplex evaluation at [facility] on 07/03/2014. The pt denies any prior neurologic deficits.   The patient denies any history of TIA or stroke symptoms, specifically the patient denies a history of amaurosis fugax or monocular blindness, denies a history unilateral  of facial drooping, denies a history of hemiplegia, and denies a history of receptive or expressive aphasia.    He has a history dating back to approximately 2010 of coronary artery disease. He reports that he was told that time he had approximately 50% stenosis in his carotid arteries. He had a more recent issue of severe bradycardia and had pacemaker placement with marked improvement in his tiredness since that time. No recent cardiac difficulties.  He works as a Presenter, broadcasting part time.   He has intermittent right hip pain that radiates to his right knee, worse in the morning, states improved in about 30 minutes.   Pt Diabetic: "borderline" Pt smoker: non-smoker  Pt meds include: Statin : yes ASA: yes Other anticoagulants/antiplatelets: no   Past Medical History:  Diagnosis Date  . B12 deficiency   . BPH (benign prostatic hyperplasia)   . Carotid artery disease ([place])   . Complete heart block ([place])   . Hyperlipidemia   . Hypertension   . Pacemaker   . Pain in joint, pelvic region and thigh   . PVD (peripheral vascular disease) ([place])    LICA 123XX123 - PVD    Social History Social History  Substance Use Topics  . Smoking status: Never Smoker  . Smokeless tobacco: Never Used  . Alcohol use No    Family History No family history on file.  Surgical History Past Surgical History:  Procedure Laterality Date  . CARDIAC CATHETERIZATION    . CHOLECYSTECTOMY    . CORONARY ANGIOPLASTY  WITH STENT PLACEMENT  11/12/08   No angiographically significant CAD - mid to distal LAD small in caliber, question a degree of vasospasm. LVEDP-41mmHg, aoritc valve stenosis very mild 9.21mmHg.  Marland Kitchen PACEMAKER INSERTION  04/29/09   Medtronic Adapta L implanted by Dr Caryl Comes for AV block    No Known Allergies  Current Outpatient Prescriptions  Medication Sig Dispense Refill  . aspirin EC 81 MG tablet Take 1 tablet (81 mg total) by mouth daily. 30 tablet 11  . Calcium Carbonate-Vitamin D (CALCIUM 500 + D PO) Take 1 capsule by mouth daily.     . Cholecalciferol (VITAMIN D3) 1000 UNITS CAPS Take 1,000 Units by mouth daily.    . ciclopirox (LOPROX) 0.77 % cream as needed.  1  . gabapentin (NEURONTIN) 300 MG capsule Take 300 mg by mouth 3 (three) times daily.      . Glucosamine-Chondroitin-MSM (TRIPLE FLEX PO) Take 1 tablet by mouth 3 (three) times daily.    Marland Kitchen lisinopril (PRINIVIL,ZESTRIL) 10 MG tablet Take 10 mg by mouth daily.    . metoprolol succinate (TOPROL-XL) 50 MG 24 hr tablet Take 1 tablet by mouth daily.    . pravastatin (PRAVACHOL) 20 MG tablet Take 20 mg by mouth at bedtime.  2  . silodosin (RAPAFLO) 8 MG CAPS capsule Take 8 mg by mouth daily with breakfast.      . vitamin C (ASCORBIC ACID) 500 MG tablet Take 500 mg by mouth daily.  No current facility-administered medications for this visit.     Review of Systems : See HPI for pertinent positives and negatives.  Physical Examination  Vitals:   09/14/16 1237 09/14/16 1239  BP: 130/65 134/68  Pulse: 65   Resp: 20   Temp: 99.4 F (37.4 C)   TempSrc: Oral   SpO2: 97%   Weight: 174 lb (78.9 kg)   Height: 5\' 3"  (1.6 m)    Body mass index is 30.82 kg/m.  General: WDWN obese male in NAD GAIT: normal Eyes: PERRLA Pulmonary: respirations are non-labored, CTAB, no rales, rhonchi, or wheezing.  Cardiac: regular rhythm,  no detected murmur. Pacemaker palpated left side of chest.  VASCULAR EXAM Carotid Bruits Right Left    Negative Negative    Aorta is not palpable. Radial pulses are 2+ palpable and equal.                                                                                                                                          LE Pulses Right Left       POPLITEAL  not palpable   not palpable       POSTERIOR TIBIAL  faintly palpable   faintly palpable        DORSALIS PEDIS      ANTERIOR TIBIAL not palpable  not palpable     Gastrointestinal: soft, nontender, BS WNL, no r/g,  no palpable masses.  Musculoskeletal: no muscle atrophy/wasting. M/S 5/5 throughout, extremities without ischemic changes.  Neurologic: A&O X 3; Appropriate Affect, Speech is normal CN 2-12 intact, pain and light touch intact in extremities, Motor exam as listed above.   Assessment: Scott [name] is a 83 y.o. male who has no hx of stroke or TIA. He is still working at age 54.  His atherosclerotic risk factors include obesity and borderline DM. Fortunately he has never used tobacco.    DATA Today's carotid duplex suggests <40% bilateral ICA stenosis. Significant stenosis of the bilateral ECA.  Bilateral vertebral artery flow is antegrade.  Bilateral subclavian artery waveforms are normal.  Stable on the right, seems to be less stenosis in the left ICA compared to the last exam on 08-24-15.  Plan: Follow-up in 2 years with Carotid Duplex scan.    I discussed in depth with the patient the nature of atherosclerosis, and emphasized the importance of maximal medical management including strict control of blood pressure, blood glucose, and lipid levels, obtaining regular exercise, and continued cessation of smoking.  The patient is aware that without maximal medical management the underlying atherosclerotic disease process will progress, limiting the benefit of any interventions. The patient was given information about stroke prevention and what symptoms should prompt the patient to seek  immediate medical care. Thank you for allowing [place] to participate in this patient's care.  Vinnie Level Nickel, RN, MSN, FNP-C [facility]:  [phone]  Clinic Physician: Oneida Alar  09/14/16 12:48 PM

## 2016-09-14 NOTE — Progress Notes (Signed)
Vitals:   09/14/16 1237 09/14/16 1239  BP: 130/65 134/68  Pulse: 65   Resp: 20   Temp: 99.4 F (37.4 C)   TempSrc: Oral   SpO2: 97%   Weight: 174 lb (78.9 kg)   Height: 5\' 3"  (1.6 m)

## 2016-09-22 NOTE — Addendum Note (Signed)
Addended by: Lianne Cure A on: 09/22/2016 09:29 AM   Modules accepted: Orders

## 2016-10-16 ENCOUNTER — Encounter: Payer: Self-pay | Admitting: Internal Medicine

## 2016-10-31 ENCOUNTER — Encounter: Payer: Medicare Other | Admitting: Internal Medicine

## 2016-11-01 ENCOUNTER — Encounter: Payer: Self-pay | Admitting: Internal Medicine

## 2017-01-25 ENCOUNTER — Encounter: Payer: Self-pay | Admitting: Internal Medicine

## 2017-01-25 ENCOUNTER — Ambulatory Visit (INDEPENDENT_AMBULATORY_CARE_PROVIDER_SITE_OTHER): Payer: Medicare Other | Admitting: Internal Medicine

## 2017-01-25 ENCOUNTER — Encounter (INDEPENDENT_AMBULATORY_CARE_PROVIDER_SITE_OTHER): Payer: Self-pay

## 2017-01-25 VITALS — BP 150/64 | HR 66 | Ht 63.0 in | Wt 169.0 lb

## 2017-01-25 DIAGNOSIS — I10 Essential (primary) hypertension: Secondary | ICD-10-CM | POA: Diagnosis not present

## 2017-01-25 DIAGNOSIS — Z950 Presence of cardiac pacemaker: Secondary | ICD-10-CM | POA: Diagnosis not present

## 2017-01-25 DIAGNOSIS — I442 Atrioventricular block, complete: Secondary | ICD-10-CM | POA: Diagnosis not present

## 2017-01-25 LAB — CUP PACEART INCLINIC DEVICE CHECK
Battery Impedance: 812 Ohm
Battery Voltage: 2.79 V
Brady Statistic AP VS Percent: 0 %
Brady Statistic AS VP Percent: 69 %
Brady Statistic AS VS Percent: 0 %
Date Time Interrogation Session: 20180712165249
Implantable Lead Implant Date: 20101014
Implantable Lead Location: 753860
Implantable Lead Model: 5076
Lead Channel Impedance Value: 409 Ohm
Lead Channel Impedance Value: 753 Ohm
Lead Channel Pacing Threshold Amplitude: 0.375 V
Lead Channel Pacing Threshold Amplitude: 0.5 V
Lead Channel Pacing Threshold Pulse Width: 0.4 ms
Lead Channel Setting Pacing Amplitude: 1.5 V
Lead Channel Setting Pacing Amplitude: 2.5 V
Lead Channel Setting Sensing Sensitivity: 5.6 mV
MDC IDC LEAD IMPLANT DT: 20101014
MDC IDC LEAD LOCATION: 753859
MDC IDC MSMT BATTERY REMAINING LONGEVITY: 68 mo
MDC IDC MSMT LEADCHNL RA PACING THRESHOLD AMPLITUDE: 0.625 V
MDC IDC MSMT LEADCHNL RA PACING THRESHOLD AMPLITUDE: 0.75 V
MDC IDC MSMT LEADCHNL RA PACING THRESHOLD PULSEWIDTH: 0.4 ms
MDC IDC MSMT LEADCHNL RA PACING THRESHOLD PULSEWIDTH: 0.4 ms
MDC IDC MSMT LEADCHNL RA SENSING INTR AMPL: 2 mV
MDC IDC MSMT LEADCHNL RV PACING THRESHOLD PULSEWIDTH: 0.4 ms
MDC IDC PG IMPLANT DT: 20101014
MDC IDC SET LEADCHNL RV PACING PULSEWIDTH: 0.4 ms
MDC IDC STAT BRADY AP VP PERCENT: 31 %

## 2017-01-25 NOTE — Progress Notes (Signed)
Patient Care Team: Angelina Sheriff, MD as PCP - General (Unknown Physician Specialty)   HPI  Scott [name] is a 83 y.o. male Seen in follow-up for pacemaker implanted many years ago. He has complete heart block.    He still works weekends  The patient denies chest pain, shortness of breath, nocturnal dyspnea, orthopnea or peripheral edema.  There have been no palpitations, lightheadedness or syncope.     n. Remote catheterization demonstrated no obstructive coronary disease (2010)  Echo 4/ 2016 normal LV funciton  Past Medical History:  Diagnosis Date  . B12 deficiency   . BPH (benign prostatic hyperplasia)   . Carotid artery disease ([place])   . Complete heart block ([place])   . Hyperlipidemia   . Hypertension   . Pacemaker   . Pain in joint, pelvic region and thigh   . PVD (peripheral vascular disease) ([facility])    LICA 72-09% - PVD    Past Surgical History:  Procedure Laterality Date  . CARDIAC CATHETERIZATION    . CHOLECYSTECTOMY    . CORONARY ANGIOPLASTY WITH STENT PLACEMENT  11/12/08   No angiographically significant CAD - mid to distal LAD small in caliber, question a degree of vasospasm. LVEDP-91mmHg, aoritc valve stenosis very mild 9.42mmHg.  Marland Kitchen PACEMAKER INSERTION  04/29/09   Medtronic Adapta L implanted by Dr Caryl Comes for AV block    Current Outpatient Prescriptions  Medication Sig Dispense Refill  . aspirin EC 81 MG tablet Take 1 tablet (81 mg total) by mouth daily. 30 tablet 11  . Calcium Carbonate-Vitamin D (CALCIUM 500 + D PO) Take 1 capsule by mouth daily.     . Cholecalciferol (VITAMIN D3) 1000 UNITS CAPS Take 1,000 Units by mouth daily.    . ciclopirox (LOPROX) 0.77 % cream as needed.  1  . gabapentin (NEURONTIN) 300 MG capsule Take 300 mg by mouth 3 (three) times daily.      . Glucosamine-Chondroitin-MSM (TRIPLE FLEX PO) Take 1 tablet by mouth 3 (three) times daily.    Marland Kitchen lisinopril (PRINIVIL,ZESTRIL) 10 MG tablet Take 10 mg by mouth daily.    .  metoprolol succinate (TOPROL-XL) 50 MG 24 hr tablet Take 1 tablet by mouth daily.    . pravastatin (PRAVACHOL) 20 MG tablet Take 20 mg by mouth at bedtime.  2  . silodosin (RAPAFLO) 8 MG CAPS capsule Take 8 mg by mouth daily with breakfast.      . vitamin C (ASCORBIC ACID) 500 MG tablet Take 500 mg by mouth daily.       No current facility-administered medications for this visit.     No Known Allergies  Review of Systems negative except from HPI and PMH  Physical Exam BP (!) 150/64   Pulse 66   Ht 5\' 3"  (1.6 m)   Wt 169 lb (76.7 kg)   SpO2 95%   BMI 29.94 kg/m  Well developed and nourished in no acute distress HENT normal Neck supple with JVP-flat Carotids brisk and full without bruits Clear Regular rate and rhythm, no murmurs or gallops Abd-soft with active BS without hepatomegaly No Clubbing cyanosis tr edema Skin-warm and dry A & Oriented  Grossly normal sensory and motor function   ECG demonstrates sinus rhythm  P-synchronous/ AV  pacing 18/20/49 Assessment and  Plan  Complete heart block  Pacemaker-Medtronic  The patient's device was interrogated.  The information was reviewed. No changes were made in the programming.     Hypertension BP  elevated   Followed in [place]

## 2017-01-25 NOTE — Patient Instructions (Addendum)
Medication Instructions:  Your physician recommends that you continue on your current medications as directed. Please refer to the Current Medication list given to you today.   Labwork: None  Testing/Procedures: None  Follow-Up: Your physician wants you to follow-up in: 1 year with Chanetta Marshall, NP. You will receive a reminder letter in the mail two months in advance. If you don't receive a letter, please call our office to schedule the follow-up appointment.   Any Other Special Instructions Will Be Listed Below (If Applicable).     If you need a refill on your cardiac medications before your next appointment, please call your pharmacy.

## 2017-03-07 DIAGNOSIS — D649 Anemia, unspecified: Secondary | ICD-10-CM | POA: Diagnosis not present

## 2017-03-07 DIAGNOSIS — R410 Disorientation, unspecified: Secondary | ICD-10-CM | POA: Diagnosis not present

## 2017-03-07 DIAGNOSIS — I459 Conduction disorder, unspecified: Secondary | ICD-10-CM | POA: Diagnosis not present

## 2017-03-14 ENCOUNTER — Encounter: Payer: Self-pay | Admitting: Neurology

## 2017-04-06 DIAGNOSIS — N400 Enlarged prostate without lower urinary tract symptoms: Secondary | ICD-10-CM | POA: Diagnosis not present

## 2017-04-06 DIAGNOSIS — Z950 Presence of cardiac pacemaker: Secondary | ICD-10-CM | POA: Diagnosis not present

## 2017-04-06 DIAGNOSIS — D649 Anemia, unspecified: Secondary | ICD-10-CM | POA: Diagnosis not present

## 2017-04-06 DIAGNOSIS — Z7982 Long term (current) use of aspirin: Secondary | ICD-10-CM | POA: Diagnosis not present

## 2017-04-06 DIAGNOSIS — Z79899 Other long term (current) drug therapy: Secondary | ICD-10-CM | POA: Diagnosis not present

## 2017-04-06 DIAGNOSIS — I10 Essential (primary) hypertension: Secondary | ICD-10-CM | POA: Diagnosis not present

## 2017-04-19 DIAGNOSIS — Z23 Encounter for immunization: Secondary | ICD-10-CM | POA: Diagnosis not present

## 2017-04-26 ENCOUNTER — Telehealth: Payer: Self-pay | Admitting: Cardiology

## 2017-04-26 ENCOUNTER — Ambulatory Visit (INDEPENDENT_AMBULATORY_CARE_PROVIDER_SITE_OTHER): Payer: Medicare Other | Admitting: *Deleted

## 2017-04-26 DIAGNOSIS — I442 Atrioventricular block, complete: Secondary | ICD-10-CM

## 2017-04-26 NOTE — Telephone Encounter (Signed)
Spoke with pt and reminded pt of remote transmission that is due today. Pt verbalized understanding.   

## 2017-04-27 ENCOUNTER — Encounter: Payer: Self-pay | Admitting: Cardiology

## 2017-04-27 NOTE — Progress Notes (Signed)
Remote pacemaker transmission.   

## 2017-05-07 LAB — CUP PACEART REMOTE DEVICE CHECK
Battery Impedance: 965 Ohm
Battery Remaining Longevity: 62 mo
Battery Voltage: 2.78 V
Brady Statistic AP VS Percent: 0 %
Brady Statistic AS VP Percent: 60 %
Implantable Lead Implant Date: 20101014
Implantable Lead Model: 5076
Implantable Lead Model: 5076
Implantable Pulse Generator Implant Date: 20101014
Lead Channel Impedance Value: 438 Ohm
Lead Channel Pacing Threshold Amplitude: 0.625 V
Lead Channel Pacing Threshold Pulse Width: 0.4 ms
Lead Channel Setting Pacing Amplitude: 1.5 V
Lead Channel Setting Pacing Amplitude: 2.5 V
Lead Channel Setting Pacing Pulse Width: 0.4 ms
MDC IDC LEAD IMPLANT DT: 20101014
MDC IDC LEAD LOCATION: 753859
MDC IDC LEAD LOCATION: 753860
MDC IDC MSMT LEADCHNL RV IMPEDANCE VALUE: 744 Ohm
MDC IDC MSMT LEADCHNL RV PACING THRESHOLD AMPLITUDE: 0.375 V
MDC IDC MSMT LEADCHNL RV PACING THRESHOLD PULSEWIDTH: 0.4 ms
MDC IDC SESS DTM: 20181012013218
MDC IDC SET LEADCHNL RV SENSING SENSITIVITY: 5.6 mV
MDC IDC STAT BRADY AP VP PERCENT: 40 %
MDC IDC STAT BRADY AS VS PERCENT: 0 %

## 2017-05-21 ENCOUNTER — Ambulatory Visit: Payer: Medicare Other | Admitting: Neurology

## 2017-05-23 ENCOUNTER — Encounter: Payer: Self-pay | Admitting: Neurology

## 2017-05-23 ENCOUNTER — Ambulatory Visit: Payer: Medicare Other | Admitting: Neurology

## 2017-05-23 VITALS — BP 120/70 | HR 67 | Ht 63.0 in | Wt 162.0 lb

## 2017-05-23 DIAGNOSIS — R410 Disorientation, unspecified: Secondary | ICD-10-CM | POA: Diagnosis not present

## 2017-05-23 NOTE — Patient Instructions (Signed)
1. Schedule head CT without contrast 2. Schedule routine EEG 3. Follow-up in 6 months, call for any changes

## 2017-05-23 NOTE — Progress Notes (Signed)
NEUROLOGY CONSULTATION NOTE  Scott [name] MRN: [id number] DOB: 09/19/1924  Referring provider: Dr. Lovette Cliche II Primary care provider: Dr. Lovette Cliche II  Reason for consult:  Transient confusion  Dear Dr Lin Landsman:  Thank you for your kind referral of Scott [name] for consultation of the above symptoms. Although his history is well known to you, please allow me to reiterate it for the purpose of our medical record. The patient was accompanied to the clinic by his wife who also provides collateral information. Records and images were personally reviewed where available.  HISTORY OF PRESENT ILLNESS: This is a pleasant 83 year old right-handed man with a history of hypertension, hyperlipidemia, complete heart block s/p pacemaker placement, presenting for evaluation of transient confusion. He and his wife give different information, it is difficult to ascertain which is historically accurate. He states he has lost track of dates and does not recall the episode (per PCP note it was in August 2018), he was leading the service at church and was "momentarily out of it." He felt like he was in a fog for around 45 seconds, he was standing at the pulpit for around 2 minutes, then fellow parishioners came up to help him and got water for him. He was able to finish leading the services. He denies any headache, dizziness, difficulty speaking, or focal weakness. His wife, on the other hand, reports that there were 2 separate incidents, both in church. He denies the first episode that she states occurred around 6 months ago. She does not remember all the details, she was in a different section of church and people called her that he fell over and passed out. When she arrived, he was coherent. He states he was aware the entire time and never lost consciousness. She recalls two separate times where parishioners insisted they drive the couple home. He does not recall feeling cold/clammy or  diaphoretic.   He denies any headaches, dizziness, diplopia, dysarthria/dysphagia, neck/back pain, bowel/bladder dysfunction. He has right sciatica and goes to a chiropractor every month. He has occasional aches in his calf. He reports having an abdominal infection in the spring several years ago, then the next summer started having tingling in both hands and feet. He was diagnosed with neuropathy, reports this has been stable and that gabapentin helps. He does note mild memory changes, he has trouble picking up words when he wants them. No falls. He and his wife deny any staring/unresponsive episodes, olfactory/gustatory hallucinations, deja vu, rising epigastric sensation, focal weakness, myoclonic jerks. He denies any alcohol use.   Diagnostic Data:  09/2016: Carotid duplex suggests <40% bilateral ICA stenosis. Significant stenosis of the bilateral ECA. Bilateral vertebral artery flow is antegrade. Bilateral subclavian artery waveforms are normal.  Stable on the right, seems to be less stenosis in the left ICA compared to the last exam on 08-24-15.   PAST MEDICAL HISTORY: Past Medical History:  Diagnosis Date  . B12 deficiency   . BPH (benign prostatic hyperplasia)   . Carotid artery disease ([place])   . Complete heart block ([place])   . Hyperlipidemia   . Hypertension   . Pacemaker   . Pain in joint, pelvic region and thigh   . PVD (peripheral vascular disease) ([place])    LICA 64-33% - PVD    PAST SURGICAL HISTORY: Past Surgical History:  Procedure Laterality Date  . CARDIAC CATHETERIZATION    . CHOLECYSTECTOMY    . CORONARY ANGIOPLASTY WITH STENT PLACEMENT  11/12/08  No angiographically significant CAD - mid to distal LAD small in caliber, question a degree of vasospasm. LVEDP-76mmHg, aoritc valve stenosis very mild 9.32mmHg.  Marland Kitchen PACEMAKER INSERTION  04/29/09   Medtronic Adapta L implanted by Dr Caryl Comes for AV block    MEDICATIONS: Current Outpatient Medications on File Prior to Visit    Medication Sig Dispense Refill  . aspirin EC 81 MG tablet Take 1 tablet (81 mg total) by mouth daily. 30 tablet 11  . Calcium Carbonate-Vitamin D (CALCIUM 500 + D PO) Take 1 capsule by mouth daily.     . Cholecalciferol (VITAMIN D3) 1000 UNITS CAPS Take 1,000 Units by mouth daily.    . ciclopirox (LOPROX) 0.77 % cream as needed.  1  . gabapentin (NEURONTIN) 300 MG capsule Take 300 mg by mouth 3 (three) times daily.      . Glucosamine-Chondroitin-MSM (TRIPLE FLEX PO) Take 1 tablet by mouth 3 (three) times daily.    Marland Kitchen lisinopril (PRINIVIL,ZESTRIL) 10 MG tablet Take 10 mg by mouth daily.    . metoprolol succinate (TOPROL-XL) 50 MG 24 hr tablet Take 1 tablet by mouth daily.    . pravastatin (PRAVACHOL) 20 MG tablet Take 20 mg by mouth at bedtime.  2  . silodosin (RAPAFLO) 8 MG CAPS capsule Take 8 mg by mouth daily with breakfast.      . vitamin C (ASCORBIC ACID) 500 MG tablet Take 500 mg by mouth daily.       No current facility-administered medications on file prior to visit.     ALLERGIES: No Known Allergies  FAMILY HISTORY: No family history on file.  SOCIAL HISTORY: Social History   Socioeconomic History  . Marital status: Married    Spouse name: Not on file  . Number of children: 5  . Years of education: Not on file  . Highest education level: Not on file  Social Needs  . Financial resource strain: Not on file  . Food insecurity - worry: Not on file  . Food insecurity - inability: Not on file  . Transportation needs - medical: Not on file  . Transportation needs - non-medical: Not on file  Occupational History  . Occupation: Security   Tobacco Use  . Smoking status: Never Smoker  . Smokeless tobacco: Never Used  Substance and Sexual Activity  . Alcohol use: No    Alcohol/week: 0.0 oz  . Drug use: No  . Sexual activity: Not on file  Other Topics Concern  . Not on file  Social History Narrative   2 caffeine drinks daily     REVIEW OF SYSTEMS: Constitutional: No  fevers, chills, or sweats, no generalized fatigue, change in appetite Eyes: No visual changes, double vision, eye pain Ear, nose and throat: No hearing loss, ear pain, nasal congestion, sore throat Cardiovascular: No chest pain, palpitations Respiratory:  No shortness of breath at rest or with exertion, wheezes GastrointestinaI: No nausea, vomiting, diarrhea, abdominal pain, fecal incontinence Genitourinary:  No dysuria, urinary retention or frequency Musculoskeletal:  No neck pain, back pain Integumentary: No rash, pruritus, skin lesions Neurological: as above Psychiatric: No depression, insomnia, anxiety Endocrine: No palpitations, fatigue, diaphoresis, mood swings, change in appetite, change in weight, increased thirst Hematologic/Lymphatic:  No anemia, purpura, petechiae. Allergic/Immunologic: no itchy/runny eyes, nasal congestion, recent allergic reactions, rashes  PHYSICAL EXAM: Vitals:   05/23/17 1402  BP: 120/70  Pulse: 67  SpO2: 95%   General: No acute distress Head:  Normocephalic/atraumatic Eyes: Fundoscopic exam shows bilateral sharp discs, no  vessel changes, exudates, or hemorrhages Neck: supple, no paraspinal tenderness, full range of motion Back: No paraspinal tenderness Heart: regular rate and rhythm Lungs: Clear to auscultation bilaterally. Vascular: No carotid bruits. Skin/Extremities: No rash, no edema Neurological Exam: Mental status: alert and oriented to person, place, and time, no dysarthria or aphasia, Fund of knowledge is appropriate.  Recent and remote memory are intact.  Attention and concentration are normal.    Able to name objects and repeat phrases. Cranial nerves: CN I: not tested CN II: pupils equal, round and reactive to light, visual fields intact, fundi unremarkable. CN III, IV, VI:  full range of motion, no nystagmus, no ptosis CN V: facial sensation intact CN VII: upper and lower face symmetric CN VIII: hearing intact to finger rub CN IX,  X: gag intact, uvula midline CN XI: sternocleidomastoid and trapezius muscles intact CN XII: tongue midline Bulk & Tone: normal, no fasciculations. Motor: 5/5 throughout with no pronator drift. Sensation: decreased cold on right hand, intact to pin, reports hyperesthesia to pin on both feet, decreased vibration to ankles bilaterally.  No extinction to double simultaneous stimulation.  Romberg test negative Deep Tendon Reflexes: +1 both UE, right patella, +2 left patella, unable to elicit reflexes on both ankles, no ankle clonus Plantar responses: downgoing bilaterally Cerebellar: no incoordination on finger to nose, heel to shin. No dysdiadochokinesia Gait: narrow-based and steady, able to tandem walk adequately. Tremor: none  IMPRESSION: This is a pleasant 83 year old right-handed man with a history of hypertension, hyperlipidemia, complete heart block s/p pacemaker placement, presenting for evaluation of transient confusion. He and his wife give different accounts. He reports he was at the pulpit at church when his mind suddenly went into a fog for 45 seconds. He recalls parishioners helping him down. His wife reports that there have been 2 incidents around 6 months apart, where she was told he passed out. She did not witness this. He denies losing consciousness at any point. His neurological exam is largely non-focal with mild length-dependent neuropathy. Etiology of symptoms unclear, head CT without contrast and routine EEG will be ordered. He is unable to do MRI due to pacemaker. We discussed [place] driving laws that one should not drive after an episode of loss of awareness/consciousness until 6 months event-free. He will ask fellow parishioners if there was any loss of consciousness witnessed. He will follow-up in 6 months and knows to call for any changes.   Thank you for allowing me to participate in the care of this patient. Please do not hesitate to call for any questions or  concerns.   Ellouise Newer, M.D.  CC: Dr. Lin Landsman

## 2017-05-28 ENCOUNTER — Other Ambulatory Visit: Payer: Self-pay

## 2017-05-28 ENCOUNTER — Encounter: Payer: Self-pay | Admitting: Neurology

## 2017-05-28 DIAGNOSIS — R410 Disorientation, unspecified: Secondary | ICD-10-CM

## 2017-05-29 ENCOUNTER — Ambulatory Visit (INDEPENDENT_AMBULATORY_CARE_PROVIDER_SITE_OTHER): Payer: Medicare Other | Admitting: Neurology

## 2017-05-29 DIAGNOSIS — R410 Disorientation, unspecified: Secondary | ICD-10-CM

## 2017-06-05 NOTE — Procedures (Signed)
ELECTROENCEPHALOGRAM REPORT  Date of Study: 05/29/2017  Patient's Name: Scott [name] MRN: [id number] Date of Birth: Oct 05, 1924  Referring Provider: Dr. Ellouise Newer  Clinical History: This is a 83 year old man with transient confusion, possible syncope.  Medications: NEURONTIN 300 MG capsule aspirin EC 81 MG tablet CALCIUM 500 + D PO capsule  VITAMIN D3 1000 UNITS CAPS  PRINIVIL,ZESTRIL 10 MG tablet  TOPROL-X 50 MG 24 hr tablet  PRAVACHOL 20 MG tablet  RAPAFLO 8 MG CAPS capsule  Technical Summary: A multichannel digital EEG recording measured by the international 10-20 system with electrodes applied with paste and impedances below 5000 ohms performed in our laboratory with EKG monitoring in an awake and asleep patient.  Hyperventilation was not performed. Photic stimulation was performed.  The digital EEG was referentially recorded, reformatted, and digitally filtered in a variety of bipolar and referential montages for optimal display.    Description: The patient is awake and asleep during the recording.  During maximal wakefulness, there is a symmetric, medium voltage 9 Hz posterior dominant rhythm that attenuates with eye opening.  The record is symmetric.  During drowsiness and stage I sleep, there is an increase in theta slowing of the background with occasional vertex waves seen. Photic stimulation did not elicit any abnormalities.  There were no epileptiform discharges or electrographic seizures seen.    EKG lead showed sinus bradycardia at 66 bpm.  Impression: This awake and asleep EEG is normal.    Clinical Correlation: A normal EEG does not exclude a clinical diagnosis of epilepsy.  If further clinical questions remain, prolonged EEG may be helpful.  Clinical correlation is advised.   Ellouise Newer, M.D.

## 2017-06-12 ENCOUNTER — Telehealth: Payer: Self-pay | Admitting: Neurology

## 2017-06-12 ENCOUNTER — Ambulatory Visit
Admission: RE | Admit: 2017-06-12 | Discharge: 2017-06-12 | Disposition: A | Discharge status: Home / Self Care | Payer: Medicare Other | Source: Ambulatory Visit | Attending: Neurology | Admitting: Neurology

## 2017-06-12 ENCOUNTER — Telehealth: Payer: Self-pay

## 2017-06-12 DIAGNOSIS — R410 Disorientation, unspecified: Secondary | ICD-10-CM | POA: Diagnosis not present

## 2017-06-12 NOTE — Telephone Encounter (Signed)
Spoke with pt relaying message below.  Pt scheduled for CT later this morning.

## 2017-06-12 NOTE — Telephone Encounter (Signed)
-----   Message from Cameron Sprang, MD sent at 06/05/2017 12:44 PM EST ----- Pls let him know the brain wave test was normal, proceed with CT head as discussed. Thanks

## 2017-06-13 NOTE — Telephone Encounter (Signed)
Error

## 2017-06-14 ENCOUNTER — Telehealth: Payer: Self-pay

## 2017-06-14 NOTE — Telephone Encounter (Signed)
-----   Message from Cameron Sprang, MD sent at 06/13/2017  2:16 PM EST ----- Pls let patient know the head CT looks good, no evidence of tumor, stroke, or bleed. It showed age-related changes. Thanks

## 2017-06-14 NOTE — Telephone Encounter (Signed)
LMOM relaying message below.  

## 2017-07-26 ENCOUNTER — Encounter: Payer: Medicare Other | Admitting: *Deleted

## 2017-07-26 ENCOUNTER — Telehealth: Payer: Self-pay | Admitting: Cardiology

## 2017-07-26 NOTE — Telephone Encounter (Signed)
Spoke with pt and reminded pt of remote transmission that is due today. Pt verbalized understanding.   

## 2017-08-01 ENCOUNTER — Ambulatory Visit (INDEPENDENT_AMBULATORY_CARE_PROVIDER_SITE_OTHER): Payer: Medicare Other | Admitting: *Deleted

## 2017-08-01 DIAGNOSIS — I442 Atrioventricular block, complete: Secondary | ICD-10-CM | POA: Diagnosis not present

## 2017-08-02 NOTE — Progress Notes (Signed)
Remote pacemaker transmission.   

## 2017-08-03 ENCOUNTER — Encounter: Payer: Self-pay | Admitting: Cardiology

## 2017-08-03 LAB — CUP PACEART REMOTE DEVICE CHECK
Battery Voltage: 2.78 V
Brady Statistic AP VS Percent: 0 %
Brady Statistic AS VP Percent: 64 %
Brady Statistic AS VS Percent: 0 %
Date Time Interrogation Session: 20190116174149
Implantable Lead Implant Date: 20101014
Implantable Lead Location: 753859
Implantable Lead Model: 5076
Lead Channel Pacing Threshold Amplitude: 0.375 V
Lead Channel Pacing Threshold Amplitude: 0.5 V
Lead Channel Pacing Threshold Pulse Width: 0.4 ms
Lead Channel Pacing Threshold Pulse Width: 0.4 ms
Lead Channel Setting Pacing Amplitude: 2.5 V
MDC IDC LEAD IMPLANT DT: 20101014
MDC IDC LEAD LOCATION: 753860
MDC IDC MSMT BATTERY IMPEDANCE: 1017 Ohm
MDC IDC MSMT BATTERY REMAINING LONGEVITY: 60 mo
MDC IDC MSMT LEADCHNL RA IMPEDANCE VALUE: 462 Ohm
MDC IDC MSMT LEADCHNL RV IMPEDANCE VALUE: 771 Ohm
MDC IDC PG IMPLANT DT: 20101014
MDC IDC SET LEADCHNL RA PACING AMPLITUDE: 1.5 V
MDC IDC SET LEADCHNL RV PACING PULSEWIDTH: 0.4 ms
MDC IDC SET LEADCHNL RV SENSING SENSITIVITY: 5.6 mV
MDC IDC STAT BRADY AP VP PERCENT: 36 %

## 2017-08-15 ENCOUNTER — Telehealth: Payer: Self-pay | Admitting: Internal Medicine

## 2017-08-15 NOTE — Telephone Encounter (Signed)
Walk In pt Form-[place] Dept of Motor Vehicles paper dropped off placed in [place] doc Box.

## 2017-08-29 ENCOUNTER — Telehealth: Payer: Self-pay | Admitting: Neurology

## 2017-08-29 NOTE — Telephone Encounter (Signed)
Spoke with pt letting him know that I have faxed DMV paperwork to [facility] in [place].

## 2017-08-29 NOTE — Telephone Encounter (Signed)
Patient called to check on the status of DMV paperwork that had been dropped off. Please Call. Thanks

## 2017-08-30 ENCOUNTER — Telehealth: Payer: Self-pay | Admitting: Cardiology

## 2017-08-30 NOTE — Telephone Encounter (Signed)
Patient called and requested that his DMV papers be mailed to him. He stated that he received a call that they were to be picked up. He stated that it is a 48 mile round trip for him to come pick the papers up and he would just rather if we mailed them to him. Instructed him I would send MD and MD nurse a message. Pt verbalized understanding.

## 2017-08-31 NOTE — Telephone Encounter (Signed)
Spoke with patient and let him DMV Papers placed in outgoing mail today.

## 2017-09-06 DIAGNOSIS — Z0000 Encounter for general adult medical examination without abnormal findings: Secondary | ICD-10-CM | POA: Diagnosis not present

## 2017-09-06 DIAGNOSIS — Z1339 Encounter for screening examination for other mental health and behavioral disorders: Secondary | ICD-10-CM | POA: Diagnosis not present

## 2017-09-06 DIAGNOSIS — D510 Vitamin B12 deficiency anemia due to intrinsic factor deficiency: Secondary | ICD-10-CM | POA: Diagnosis not present

## 2017-09-06 DIAGNOSIS — Z1331 Encounter for screening for depression: Secondary | ICD-10-CM | POA: Diagnosis not present

## 2017-09-06 DIAGNOSIS — Z79899 Other long term (current) drug therapy: Secondary | ICD-10-CM | POA: Diagnosis not present

## 2017-09-12 ENCOUNTER — Telehealth: Payer: Self-pay | Admitting: Neurology

## 2017-09-12 NOTE — Telephone Encounter (Signed)
Letter written and placed in outgoing [employer] mail

## 2017-09-12 NOTE — Telephone Encounter (Signed)
Spoke to his PCP Dr. Lin Landsman about return to driving. The patient has heard back from the [facility] in [place] and they are recommending a driving evaluation that costs $700, which is cost-prohibitive for him. I discussed with Dr. Lin Landsman that he has not had any events since August, which is 6 months event-free, however I am hesitant to release him to driving due to age and if DMV recommends a driving eval, I would go ahead with this as well before releasing him to driving. There are other places that do driving eval that he could potentially try, our office will send him a list.  [place], pls send a letter to Scott [name] that there are other options for places he may do the driving evaluation that may not be as cost-prohibitive as the place [facility] is suggesting, and attach a list of Occupational psychologist. Thanks!

## 2017-10-10 DIAGNOSIS — D649 Anemia, unspecified: Secondary | ICD-10-CM | POA: Diagnosis not present

## 2017-10-31 ENCOUNTER — Telehealth: Payer: Self-pay | Admitting: Cardiology

## 2017-10-31 ENCOUNTER — Ambulatory Visit (INDEPENDENT_AMBULATORY_CARE_PROVIDER_SITE_OTHER): Payer: Medicare Other | Admitting: *Deleted

## 2017-10-31 DIAGNOSIS — I442 Atrioventricular block, complete: Secondary | ICD-10-CM | POA: Diagnosis not present

## 2017-10-31 NOTE — Telephone Encounter (Signed)
Spoke with pt and reminded pt of remote transmission that is due today. Pt verbalized understanding.   

## 2017-11-01 ENCOUNTER — Encounter: Payer: Self-pay | Admitting: Cardiology

## 2017-11-01 LAB — CUP PACEART REMOTE DEVICE CHECK
Battery Impedance: 1070 Ohm
Battery Remaining Longevity: 58 mo
Brady Statistic AP VP Percent: 35 %
Brady Statistic AS VP Percent: 65 %
Date Time Interrogation Session: 20190417162114
Implantable Lead Location: 753859
Implantable Lead Model: 5076
Implantable Lead Model: 5076
Implantable Pulse Generator Implant Date: 20101014
Lead Channel Impedance Value: 421 Ohm
Lead Channel Pacing Threshold Amplitude: 0.375 V
Lead Channel Pacing Threshold Amplitude: 0.625 V
Lead Channel Sensing Intrinsic Amplitude: 1.4 mV
Lead Channel Setting Pacing Amplitude: 1.5 V
Lead Channel Setting Pacing Pulse Width: 0.4 ms
MDC IDC LEAD IMPLANT DT: 20101014
MDC IDC LEAD IMPLANT DT: 20101014
MDC IDC LEAD LOCATION: 753860
MDC IDC MSMT BATTERY VOLTAGE: 2.78 V
MDC IDC MSMT LEADCHNL RA PACING THRESHOLD PULSEWIDTH: 0.4 ms
MDC IDC MSMT LEADCHNL RV IMPEDANCE VALUE: 736 Ohm
MDC IDC MSMT LEADCHNL RV PACING THRESHOLD PULSEWIDTH: 0.4 ms
MDC IDC SET LEADCHNL RV PACING AMPLITUDE: 2.5 V
MDC IDC SET LEADCHNL RV SENSING SENSITIVITY: 5.6 mV
MDC IDC STAT BRADY AP VS PERCENT: 0 %
MDC IDC STAT BRADY AS VS PERCENT: 0 %

## 2017-11-01 NOTE — Progress Notes (Signed)
Remote pacemaker transmission.   

## 2017-11-30 ENCOUNTER — Encounter: Payer: Self-pay | Admitting: Neurology

## 2017-11-30 ENCOUNTER — Ambulatory Visit: Payer: Medicare Other | Admitting: Neurology

## 2017-11-30 ENCOUNTER — Other Ambulatory Visit: Payer: Self-pay

## 2017-11-30 VITALS — BP 128/86 | HR 78 | Ht 64.0 in | Wt 166.0 lb

## 2017-11-30 DIAGNOSIS — R410 Disorientation, unspecified: Secondary | ICD-10-CM
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
## 2017-11-30 NOTE — Patient Instructions (Signed)
Great seeing you. Driving evaluation indicated no driving restrictions. Please let us know if the DMV needs any paperwork from our office. Follow-up as needed, call for any changes

## 2017-12-27 ENCOUNTER — Encounter: Payer: Self-pay | Admitting: Nurse Practitioner

## 2018-01-29 DIAGNOSIS — M5431 Sciatica, right side: Secondary | ICD-10-CM | POA: Diagnosis not present

## 2018-01-30 ENCOUNTER — Ambulatory Visit (INDEPENDENT_AMBULATORY_CARE_PROVIDER_SITE_OTHER): Payer: Medicare Other | Admitting: *Deleted

## 2018-01-30 ENCOUNTER — Telehealth: Payer: Self-pay

## 2018-01-30 DIAGNOSIS — I442 Atrioventricular block, complete: Secondary | ICD-10-CM

## 2018-01-30 NOTE — Progress Notes (Signed)
Remote pacemaker transmission.   

## 2018-01-30 NOTE — Telephone Encounter (Signed)
I spoke with the patient about this.

## 2018-01-31 ENCOUNTER — Encounter: Payer: Self-pay | Admitting: Cardiology

## 2018-01-31 ENCOUNTER — Encounter: Payer: Medicare Other | Admitting: Nurse Practitioner

## 2018-02-07 LAB — CUP PACEART REMOTE DEVICE CHECK
Battery Impedance: 1200 Ohm
Brady Statistic AS VS Percent: 1 %
Date Time Interrogation Session: 20190717160731
Implantable Lead Implant Date: 20101014
Implantable Lead Implant Date: 20101014
Lead Channel Impedance Value: 821 Ohm
Lead Channel Pacing Threshold Amplitude: 0.375 V
Lead Channel Pacing Threshold Pulse Width: 0.4 ms
Lead Channel Setting Sensing Sensitivity: 5.6 mV
MDC IDC LEAD LOCATION: 753859
MDC IDC LEAD LOCATION: 753860
MDC IDC MSMT BATTERY REMAINING LONGEVITY: 55 mo
MDC IDC MSMT BATTERY VOLTAGE: 2.78 V
MDC IDC MSMT LEADCHNL RA IMPEDANCE VALUE: 457 Ohm
MDC IDC MSMT LEADCHNL RA PACING THRESHOLD AMPLITUDE: 0.5 V
MDC IDC MSMT LEADCHNL RA PACING THRESHOLD PULSEWIDTH: 0.4 ms
MDC IDC PG IMPLANT DT: 20101014
MDC IDC SET LEADCHNL RA PACING AMPLITUDE: 1.5 V
MDC IDC SET LEADCHNL RV PACING AMPLITUDE: 2.5 V
MDC IDC SET LEADCHNL RV PACING PULSEWIDTH: 0.4 ms
MDC IDC STAT BRADY AP VP PERCENT: 36 %
MDC IDC STAT BRADY AP VS PERCENT: 0 %
MDC IDC STAT BRADY AS VP PERCENT: 64 %

## 2018-02-11 NOTE — Progress Notes (Signed)
Electrophysiology Office Note Date: 02/13/2018  ID:  Scott [name], DOB 10-Oct-1924, MRN [id number]  PCP: Scott Sheriff, MD Electrophysiologist: Scott [name]  CC: Pacemaker follow-up  Scott [name] is a 84 y.o. male seen today for Dr Scott [name].  He presents today for routine electrophysiology followup.  Since last being seen in our clinic, the patient reports doing relatively well.  He has right leg pain that is being evaluated by neurology.  He denies chest pain, palpitations, dyspnea, PND, orthopnea, nausea, vomiting, dizziness, syncope, edema, weight gain, or early satiety.  Device History: MDT dual chamber PPM implanted 2010 for complete heart block    Past Medical History:  Diagnosis Date  . B12 deficiency   . BPH (benign prostatic hyperplasia)   . Carotid artery disease ([place])   . Complete heart block ([facility])    a. s/p MDT dual chamber PPM   . Hyperlipidemia   . Hypertension   . Pain in joint, pelvic region and thigh   . PVD (peripheral vascular disease) ([facility])    LICA 09-47% - PVD   Past Surgical History:  Procedure Laterality Date  . CHOLECYSTECTOMY    . CORONARY ANGIOPLASTY WITH STENT PLACEMENT  11/12/08   No angiographically significant CAD - mid to distal LAD small in caliber, question a degree of vasospasm. LVEDP-73mmHg, aoritc valve stenosis very mild 9.54mmHg.  Marland Kitchen PACEMAKER INSERTION  04/29/09   Medtronic Adapta L implanted by Dr Scott [name] for AV block    Current Outpatient Medications  Medication Sig Dispense Refill  . aspirin EC 81 MG tablet Take 1 tablet (81 mg total) by mouth daily. 30 tablet 11  . Calcium Carbonate-Vitamin D (CALCIUM 500 + D PO) Take 1 capsule by mouth daily.     . Cholecalciferol (VITAMIN D3) 1000 UNITS CAPS Take 1,000 Units by mouth daily.    . ciclopirox (LOPROX) 0.77 % cream as needed.  1  . gabapentin (NEURONTIN) 300 MG capsule Take 1 cap in AM, 1 cap at noon, 2 caps at bedtime 120 capsule 11  . Glucosamine-Chondroitin-MSM (TRIPLE FLEX  PO) Take 1 tablet by mouth 3 (three) times daily.    Marland Kitchen lisinopril (PRINIVIL,ZESTRIL) 10 MG tablet Take 10 mg by mouth daily.    . metoprolol succinate (TOPROL-XL) 50 MG 24 hr tablet Take 1 tablet by mouth daily.    . pravastatin (PRAVACHOL) 20 MG tablet Take 20 mg by mouth at bedtime.  2  . silodosin (RAPAFLO) 8 MG CAPS capsule Take 8 mg by mouth daily with breakfast.      . vitamin C (ASCORBIC ACID) 500 MG tablet Take 500 mg by mouth daily.       No current facility-administered medications for this visit.     Allergies:   Patient has no known allergies.   Social History: Social History   Socioeconomic History  . Marital status: Married    Spouse name: Not on file  . Number of children: 5  . Years of education: masters +  . Highest education level: 10th grade  Occupational History  . Occupation: Security   Social Needs  . Financial resource strain: Not on file  . Food insecurity:    Worry: Not on file    Inability: Not on file  . Transportation needs:    Medical: Not on file    Non-medical: Not on file  Tobacco Use  . Smoking status: Never Smoker  . Smokeless tobacco: Never Used  Substance and Sexual Activity  . Alcohol  use: No    Alcohol/week: 0.0 oz  . Drug use: No  . Sexual activity: Not on file  Lifestyle  . Physical activity:    Days per week: Not on file    Minutes per session: Not on file  . Stress: Not on file  Relationships  . Social connections:    Talks on phone: Not on file    Gets together: Not on file    Attends religious service: Not on file    Active member of club or organization: Not on file    Attends meetings of clubs or organizations: Not on file    Relationship status: Not on file  . Intimate partner violence:    Fear of current or ex partner: Not on file    Emotionally abused: Not on file    Physically abused: Not on file    Forced sexual activity: Not on file  Other Topics Concern  . Not on file  Social History Narrative   2  caffeine drinks daily.  Lives with wife in a one story home.  Has 5 daughters.  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  Education: masters +.    Family History: History reviewed. No pertinent family history.   Review of Systems: All other systems reviewed and are otherwise negative except as noted above.   Physical Exam: VS:  BP 100/68   Pulse 81   Ht 5' 3.5" (1.613 m)   Wt 158 lb (71.7 kg)   SpO2 94%   BMI 27.55 kg/m  , BMI Body mass index is 27.55 kg/m.  GEN- The patient is elderly appearing, alert and oriented x 3 today.   HEENT: normocephalic, atraumatic; sclera clear, conjunctiva pink; hearing intact; oropharynx clear; neck supple  Lungs- Clear to ausculation bilaterally, normal work of breathing.  No wheezes, rales, rhonchi Heart- Regular rate and rhythm  GI- soft, non-tender, non-distended, bowel sounds present  Extremities- no clubbing, cyanosis, or edema  MS- no significant deformity or atrophy Skin- warm and dry, no rash or lesion; PPM pocket well healed Psych- euthymic mood, full affect Neuro- strength and sensation are intact  PPM Interrogation- reviewed in detail today,  See PACEART report  EKG:  EKG is not ordered today.  Recent Labs: No results found for requested labs within last 8760 hours.   Wt Readings from Last 3 Encounters:  02/13/18 158 lb (71.7 kg)  02/12/18 160 lb (72.6 kg)  11/30/17 166 lb (75.3 kg)     Other studies Reviewed: Additional studies/ records that were reviewed today include: Dr Olin Pia office notes   Assessment and Plan:  1.  Complete heart block  Normal PPM function See Pace Art report No changes today  2.  HTN Stable No change required today   Current medicines are reviewed at length with the patient today.   The patient does not have concerns regarding his medicines.  The following changes were made today:  none  Labs/ tests ordered today include: none No orders of the defined types were placed  in this encounter.    Disposition:   Follow up with [facility], Dr Scott [name] 1 year     Signed, Chanetta Marshall, NP 02/13/2018 1:15 PM  [place]  [phone] (office) [phone] (fax)

## 2018-02-12 ENCOUNTER — Ambulatory Visit: Payer: Medicare Other | Admitting: Neurology

## 2018-02-12 ENCOUNTER — Encounter: Payer: Self-pay | Admitting: Neurology

## 2018-02-12 VITALS — BP 116/68 | HR 84 | Ht 63.5 in | Wt 160.0 lb

## 2018-02-12 DIAGNOSIS — M25551 Pain in right hip: Secondary | ICD-10-CM | POA: Diagnosis not present

## 2018-02-12 DIAGNOSIS — M79604 Pain in right leg: Secondary | ICD-10-CM | POA: Diagnosis not present

## 2018-02-12 MED ORDER — GABAPENTIN 300 MG PO CAPS: ORAL_CAPSULE | ORAL | 11 refills | Status: DC

## 2018-02-12 NOTE — Progress Notes (Signed)
NEUROLOGY FOLLOW UP OFFICE NOTE  [name] [id number] 08/20/1924  HISTORY OF PRESENT ILLNESS: I had the pleasure of seeing Scott [name] in follow-up in the neurology clinic on 02/12/2018.  The patient was last seen 2 months ago for an episode of transient confusion. He is alone in the office today. He presents for an earlier visit for evaluation of peripheral neuropathy. He reports pain starting in his right hip from the buttock region, going to the front of his thigh, worse in the calf region. He describes an ache, states it is not sharp/stabbing/burning, intermittent, usually more noticeable in the morning. No numbness/tingling. No bowel/bladder dysfunction. He denies any recent falls. He tried steroids for a week which caused his BP to fluctuate. He is taking gabepentin 300mg  TID without side effects. He denies any further confusional episodes or similar spells since August 2018. He reports continued difficulties getting his license reinstated.   HPI 05/23/2017: This is a pleasant 84 yo RH man with a history of hypertension, hyperlipidemia, complete heart block s/p pacemaker placement, who presented for evaluation of transient confusion. He and his wife give different information, it is difficult to ascertain which is historically accurate. He states he has lost track of dates and does not recall the episode (per PCP note it was in August 2018), he was leading the service at church and was "momentarily out of it." He felt like he was in a fog for around 45 seconds, he was standing at the pulpit for around 2 minutes, then fellow parishioners came up to help him and got water for him. He was able to finish leading the services. He denies any headache, dizziness, difficulty speaking, or focal weakness. His wife, on the other hand, reports that there were 2 separate incidents, both in church. He denies the first episode that she states occurred around 6 months ago. She does not remember all the  details, she was in a different section of church and people called her that he fell over and passed out. When she arrived, he was coherent. He states he was aware the entire time and never lost consciousness. She recalls two separate times where parishioners insisted they drive the couple home. He does not recall feeling cold/clammy or diaphoretic.   He denies any headaches, dizziness, diplopia, dysarthria/dysphagia, neck/back pain, bowel/bladder dysfunction. He has right sciatica and goes to a chiropractor every month. He has occasional aches in his calf. He reports having an abdominal infection in the spring several years ago, then the next summer started having tingling in both hands and feet. He was diagnosed with neuropathy, reports this has been stable and that gabapentin helps. He does note mild memory changes, he has trouble picking up words when he wants them. No falls. He and his wife deny any staring/unresponsive episodes, olfactory/gustatory hallucinations, deja vu, rising epigastric sensation, focal weakness, myoclonic jerks. He denies any alcohol use.   Diagnostic Data:  09/2016: Carotid duplex suggests <40%bilateralICA stenosis. Significant stenosis of the bilateral ECA.Bilateral vertebral artery flow is antegrade. Bilateral subclavian artery waveforms are normal. Stable on the right, seems to be less stenosis in the left ICA compared to the last exam on 08-24-15.  PAST MEDICAL HISTORY: Past Medical History:  Diagnosis Date  . B12 deficiency   . BPH (benign prostatic hyperplasia)   . Carotid artery disease ([place])   . Complete heart block ([place])   . Hyperlipidemia   . Hypertension   . Pacemaker   . Pain in joint, pelvic  region and thigh   . PVD (peripheral vascular disease) (HCC)    LICA 63-14% - PVD    MEDICATIONS: Current Outpatient Medications on File Prior to Visit  Medication Sig Dispense Refill  . aspirin EC 81 MG tablet Take 1 tablet (81 mg total) by mouth daily. 30  tablet 11  . Calcium Carbonate-Vitamin D (CALCIUM 500 + D PO) Take 1 capsule by mouth daily.     . Cholecalciferol (VITAMIN D3) 1000 UNITS CAPS Take 1,000 Units by mouth daily.    . ciclopirox (LOPROX) 0.77 % cream as needed.  1  . gabapentin (NEURONTIN) 300 MG capsule Take 300 mg by mouth 3 (three) times daily.      . Glucosamine-Chondroitin-MSM (TRIPLE FLEX PO) Take 1 tablet by mouth 3 (three) times daily.    Marland Kitchen lisinopril (PRINIVIL,ZESTRIL) 10 MG tablet Take 10 mg by mouth daily.    . metoprolol succinate (TOPROL-XL) 50 MG 24 hr tablet Take 1 tablet by mouth daily.    . pravastatin (PRAVACHOL) 20 MG tablet Take 20 mg by mouth at bedtime.  2  . silodosin (RAPAFLO) 8 MG CAPS capsule Take 8 mg by mouth daily with breakfast.      . vitamin C (ASCORBIC ACID) 500 MG tablet Take 500 mg by mouth daily.       No current facility-administered medications on file prior to visit.     ALLERGIES: No Known Allergies  FAMILY HISTORY: No family history on file.  SOCIAL HISTORY: Social History   Socioeconomic History  . Marital status: Married    Spouse name: Not on file  . Number of children: 5  . Years of education: masters +  . Highest education level: 10th grade  Occupational History  . Occupation: Security   Social Needs  . Financial resource strain: Not on file  . Food insecurity:    Worry: Not on file    Inability: Not on file  . Transportation needs:    Medical: Not on file    Non-medical: Not on file  Tobacco Use  . Smoking status: Never Smoker  . Smokeless tobacco: Never Used  Substance and Sexual Activity  . Alcohol use: No    Alcohol/week: 0.0 oz  . Drug use: No  . Sexual activity: Not on file  Lifestyle  . Physical activity:    Days per week: Not on file    Minutes per session: Not on file  . Stress: Not on file  Relationships  . Social connections:    Talks on phone: Not on file    Gets together: Not on file    Attends religious service: Not on file    Active  member of club or organization: Not on file    Attends meetings of clubs or organizations: Not on file    Relationship status: Not on file  . Intimate partner violence:    Fear of current or ex partner: Not on file    Emotionally abused: Not on file    Physically abused: Not on file    Forced sexual activity: Not on file  Other Topics Concern  . Not on file  Social History Narrative   2 caffeine drinks daily.  Lives with wife in a one story home.  Has 5 daughters.  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  Education: masters +.    REVIEW OF SYSTEMS: Constitutional: No fevers, chills, or sweats, no generalized fatigue, change in appetite Eyes: No visual changes, double vision,  eye pain Ear, nose and throat: No hearing loss, ear pain, nasal congestion, sore throat Cardiovascular: No chest pain, palpitations Respiratory:  No shortness of breath at rest or with exertion, wheezes GastrointestinaI: No nausea, vomiting, diarrhea, abdominal pain, fecal incontinence Genitourinary:  No dysuria, urinary retention or frequency Musculoskeletal:  No neck pain,+ back pain Integumentary: No rash, pruritus, skin lesions Neurological: as above Psychiatric: No depression, insomnia, anxiety Endocrine: No palpitations, fatigue, diaphoresis, mood swings, change in appetite, change in weight, increased thirst Hematologic/Lymphatic:  No anemia, purpura, petechiae. Allergic/Immunologic: no itchy/runny eyes, nasal congestion, recent allergic reactions, rashes  PHYSICAL EXAM: Vitals:   02/12/18 1615  BP: 116/68  Pulse: 84  SpO2: 94%   General: No acute distress Head:  Normocephalic/atraumatic Neck: supple, no paraspinal tenderness, full range of motion Heart:  Regular rate and rhythm Lungs:  Clear to auscultation bilaterally Back: No paraspinal tenderness Skin/Extremities: No rash, no edema Neurological Exam: alert and oriented to person, place, and time. No aphasia or dysarthria.  Fund of knowledge is appropriate.  Recent and remote memory are intact.  Attention and concentration are normal.    Able to name objects and repeat phrases. Cranial nerves: Pupils equal, round. No facial asymmetry.Motor: 5/5 throughout with no pronotor drift. Sensory: intact to all modalities on both UE, decreased vibration to ankle on right, hyperesthesia to pin on both feet. Reflexes: +2 both UE, left patella, unable to elicit right patellar and ankle jerks bilaterally. Gait slow and cautious reporting slight pain in his right hip.  IMPRESSION: This is a pleasant 84 yo RH man with a history of hypertension, hyperlipidemia, complete heart block s/p pacemaker placement, who had an episode of transient confusion in August 2018, he reported his mind suddenly went into a fog for 45 seconds. No events since August 2018. Unable to do MRI due to pacemaker, CT head and EEG normal. He presents for an earlier visit for evaluation of peripheral neuropathy, although he mostly reports pain in his hip radiating down the left, worse in his calf. Right hip xray will be ordered. We discussed how PT can be helpful both for hip pain and possible radiculopathy, he would like to try increasing gabapentin first, take 300mg  in AM, 300mg  at noon, 600mg  qhs. He will follow-up in 6 months and knows to call for any changes.   Thank you for allowing me to participate in his care.  Please do not hesitate to call for any questions or concerns.  The duration of this appointment visit was 30 minutes of face-to-face time with the patient.  Greater than 50% of this time was spent in counseling, explanation of diagnosis, planning of further management, and coordination of care.   Ellouise Newer, M.D.   CC: Dr. Lin Landsman

## 2018-02-12 NOTE — Patient Instructions (Addendum)
1. Schedule right hip xray 2. Increase Gabapentin 300mg : take 1 cap in AM, 1 cap at noon, 2 caps at bedtime 3. If pain continues, would recommend doing Physical Therapy 4. Follow-up in 6 months, call for any changes

## 2018-02-13 ENCOUNTER — Ambulatory Visit: Payer: Medicare Other | Admitting: Nurse Practitioner

## 2018-02-13 ENCOUNTER — Encounter: Payer: Self-pay | Admitting: Nurse Practitioner

## 2018-02-13 VITALS — BP 100/68 | HR 81 | Ht 63.5 in | Wt 158.0 lb

## 2018-02-13 DIAGNOSIS — I10 Essential (primary) hypertension: Secondary | ICD-10-CM | POA: Diagnosis not present

## 2018-02-13 DIAGNOSIS — I442 Atrioventricular block, complete: Secondary | ICD-10-CM | POA: Diagnosis not present

## 2018-02-13 NOTE — Patient Instructions (Addendum)
Medication Instructions:   Your physician recommends that you continue on your current medications as directed. Please refer to the Current Medication list given to you today.   If you need a refill on your cardiac medications before your next appointment, please call your pharmacy.  Labwork: NONE ORDERED  TODAY    Testing/Procedures: NONE ORDERED  TODAY    Follow-Up:  Your physician wants you to follow-up in: [place] will receive a reminder letter in the mail two months in advance. If you don't receive a letter, please call our office to schedule the follow-up appointment.  Remote monitoring is used to monitor your Pacemaker of ICD from home. This monitoring reduces the number of office visits required to check your device to one time per year. It allows [place] to keep an eye on the functioning of your device to ensure it is working properly. You are scheduled for a device check from home on . 05-01-18  You may send your transmission at any time that day. If you have a wireless device, the transmission will be sent automatically. After your physician reviews your transmission, you will receive a postcard with your next transmission date.     Any Other Special Instructions Will Be Listed Below (If Applicable).

## 2018-02-14 LAB — CUP PACEART INCLINIC DEVICE CHECK
Date Time Interrogation Session: 20190801064300
Implantable Lead Location: 753860
Implantable Lead Model: 5076
Implantable Lead Model: 5076
MDC IDC LEAD IMPLANT DT: 20101014
MDC IDC LEAD IMPLANT DT: 20101014
MDC IDC LEAD LOCATION: 753859
MDC IDC PG IMPLANT DT: 20101014

## 2018-02-18 ENCOUNTER — Encounter: Payer: Self-pay | Admitting: Neurology

## 2018-02-18 DIAGNOSIS — M1611 Unilateral primary osteoarthritis, right hip: Secondary | ICD-10-CM | POA: Diagnosis not present

## 2018-02-18 DIAGNOSIS — M79604 Pain in right leg: Secondary | ICD-10-CM | POA: Diagnosis not present

## 2018-02-18 DIAGNOSIS — M25551 Pain in right hip: Secondary | ICD-10-CM | POA: Diagnosis not present

## 2018-02-25 ENCOUNTER — Encounter: Payer: Self-pay | Admitting: Neurology

## 2018-02-27 ENCOUNTER — Telehealth: Payer: Self-pay | Admitting: Neurology

## 2018-02-27 DIAGNOSIS — M79604 Pain in right leg: Secondary | ICD-10-CM

## 2018-02-27 DIAGNOSIS — M25551 Pain in right hip: Secondary | ICD-10-CM

## 2018-02-27 NOTE — Telephone Encounter (Signed)
Spoke with pt relaying message below.  He states that he would like PT referral.

## 2018-02-27 NOTE — Telephone Encounter (Signed)
Referral faxed to [facility] in [place]

## 2018-02-27 NOTE — Telephone Encounter (Signed)
Patient wants to know what the r-ray showed he has not gotten the results

## 2018-02-27 NOTE — Telephone Encounter (Signed)
Pls let him know the xray showed mild arthritis changes in the right hip, but also arthritis changes in his lower back, worse on the right. We had talked about how PT can be helpful, would recommend at this point if gabapentin is not helping. Thanks

## 2018-02-27 NOTE — Telephone Encounter (Signed)
Have these come in?  The order that Luvenia Starch put in says external but does not state a location.

## 2018-03-01 DIAGNOSIS — M25551 Pain in right hip: Secondary | ICD-10-CM | POA: Diagnosis not present

## 2018-03-01 DIAGNOSIS — M25651 Stiffness of right hip, not elsewhere classified: Secondary | ICD-10-CM | POA: Diagnosis not present

## 2018-03-01 DIAGNOSIS — M6281 Muscle weakness (generalized): Secondary | ICD-10-CM | POA: Diagnosis not present

## 2018-03-08 DIAGNOSIS — M6281 Muscle weakness (generalized): Secondary | ICD-10-CM | POA: Diagnosis not present

## 2018-03-08 DIAGNOSIS — M25651 Stiffness of right hip, not elsewhere classified: Secondary | ICD-10-CM | POA: Diagnosis not present

## 2018-03-08 DIAGNOSIS — M25551 Pain in right hip: Secondary | ICD-10-CM | POA: Diagnosis not present

## 2018-03-22 DIAGNOSIS — M6281 Muscle weakness (generalized): Secondary | ICD-10-CM | POA: Diagnosis not present

## 2018-03-22 DIAGNOSIS — M25551 Pain in right hip: Secondary | ICD-10-CM | POA: Diagnosis not present

## 2018-03-22 DIAGNOSIS — M25651 Stiffness of right hip, not elsewhere classified: Secondary | ICD-10-CM | POA: Diagnosis not present

## 2018-04-03 DIAGNOSIS — M6281 Muscle weakness (generalized): Secondary | ICD-10-CM | POA: Diagnosis not present

## 2018-04-03 DIAGNOSIS — M25651 Stiffness of right hip, not elsewhere classified: Secondary | ICD-10-CM | POA: Diagnosis not present

## 2018-04-03 DIAGNOSIS — M25551 Pain in right hip: Secondary | ICD-10-CM | POA: Diagnosis not present

## 2018-04-06 ENCOUNTER — Other Ambulatory Visit: Payer: Self-pay | Admitting: Neurology

## 2018-04-10 DIAGNOSIS — M6281 Muscle weakness (generalized): Secondary | ICD-10-CM | POA: Diagnosis not present

## 2018-04-10 DIAGNOSIS — M25551 Pain in right hip: Secondary | ICD-10-CM | POA: Diagnosis not present

## 2018-04-10 DIAGNOSIS — M25651 Stiffness of right hip, not elsewhere classified: Secondary | ICD-10-CM | POA: Diagnosis not present

## 2018-04-18 DIAGNOSIS — Z23 Encounter for immunization: Secondary | ICD-10-CM | POA: Diagnosis not present

## 2018-04-26 DIAGNOSIS — M6281 Muscle weakness (generalized): Secondary | ICD-10-CM | POA: Diagnosis not present

## 2018-04-26 DIAGNOSIS — M25651 Stiffness of right hip, not elsewhere classified: Secondary | ICD-10-CM | POA: Diagnosis not present

## 2018-04-26 DIAGNOSIS — M25551 Pain in right hip: Secondary | ICD-10-CM | POA: Diagnosis not present

## 2018-05-01 ENCOUNTER — Telehealth: Payer: Self-pay | Admitting: Cardiology

## 2018-05-01 ENCOUNTER — Ambulatory Visit (INDEPENDENT_AMBULATORY_CARE_PROVIDER_SITE_OTHER): Payer: Medicare Other | Admitting: *Deleted

## 2018-05-01 DIAGNOSIS — I442 Atrioventricular block, complete: Secondary | ICD-10-CM | POA: Diagnosis not present

## 2018-05-01 NOTE — Telephone Encounter (Signed)
Spoke with pt and reminded pt of remote transmission that is due today. Pt verbalized understanding.   

## 2018-05-02 NOTE — Progress Notes (Signed)
Remote pacemaker transmission.   

## 2018-05-03 ENCOUNTER — Encounter: Payer: Self-pay | Admitting: Cardiology

## 2018-05-08 IMAGING — CT CT HEAD W/O CM
1 series · 16 of 30 positions shown, 20 images · non-contrast
Comparison: CT 01/05/2013

CLINICAL DATA: Transient confusion

EXAM:
CT HEAD WITHOUT CONTRAST
TECHNIQUE: Contiguous axial images were obtained from the base of the skull
through the vertex without intravenous contrast.

[Series 2: head w/(date) · axial · 0.42mm/px · z∈[-156,-21]mm · 16 of 30 slices shown, 20 images]
[im 2/30  brain]
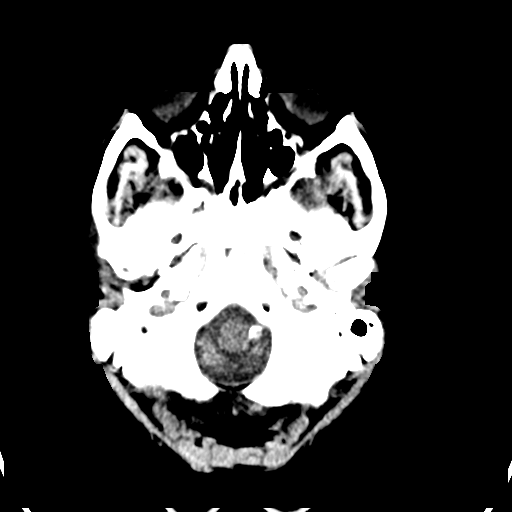
[im 2/30  bone]
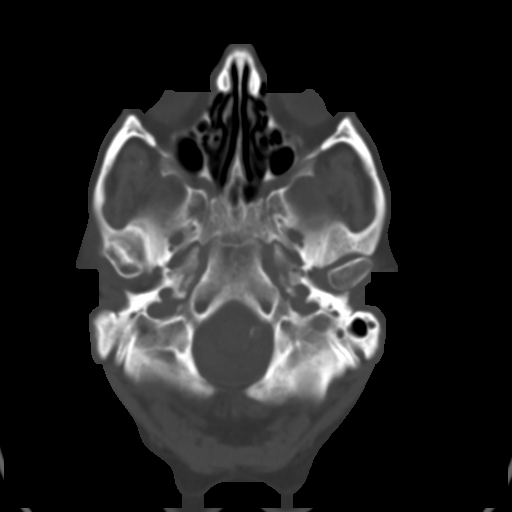
[im 4/30  brain]
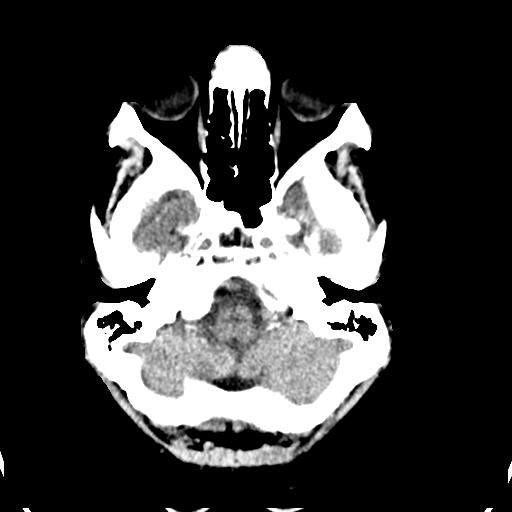
[im 6/30  brain]
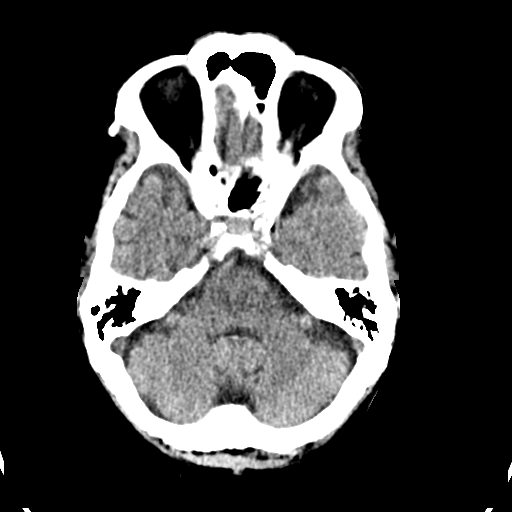
[im 8/30  brain]
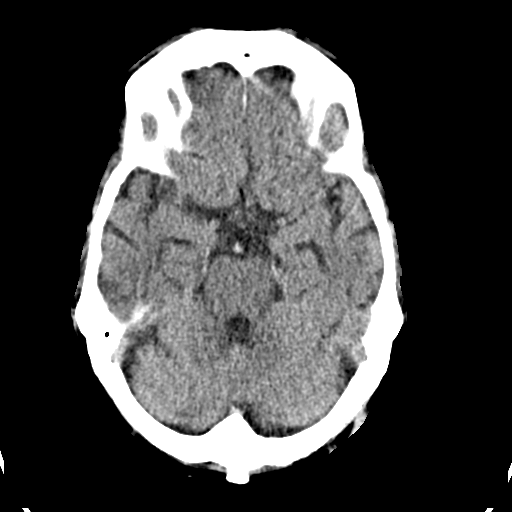
[im 9/30  brain]
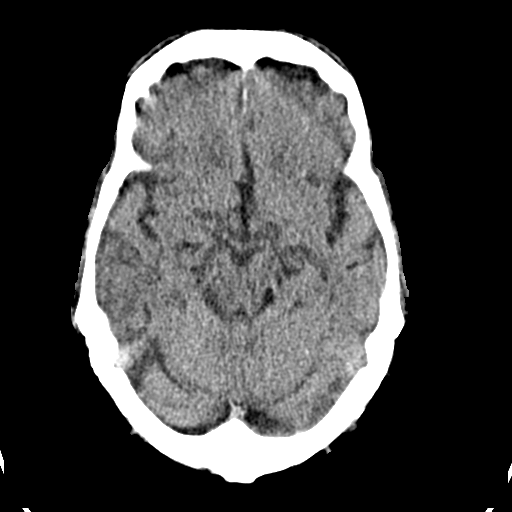
[im 9/30  bone]
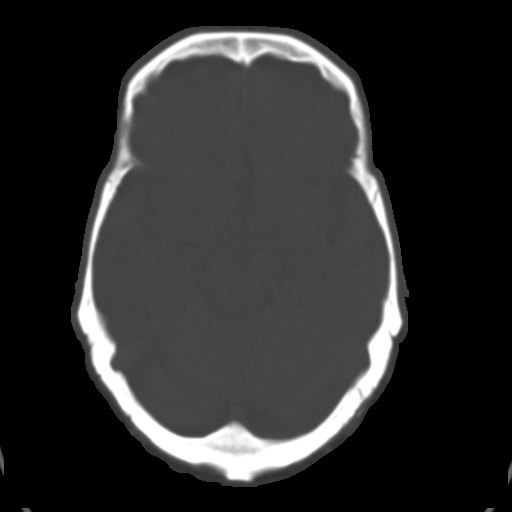
[im 11/30  brain]
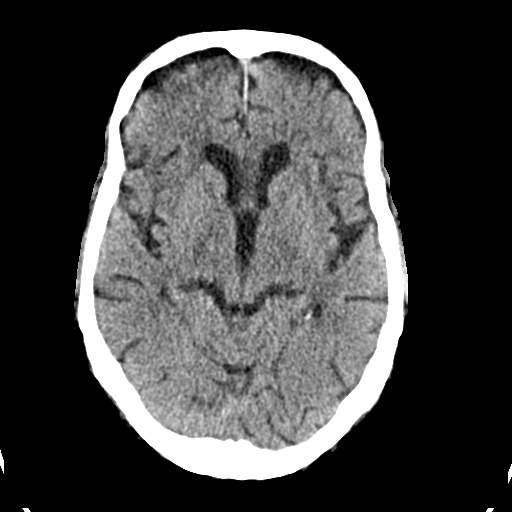
[im 13/30  brain]
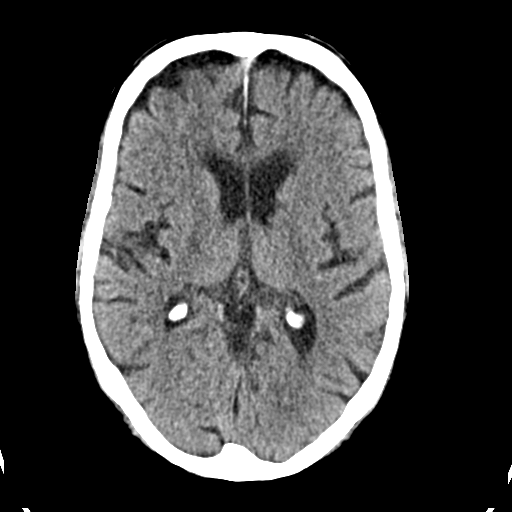
[im 15/30  brain]
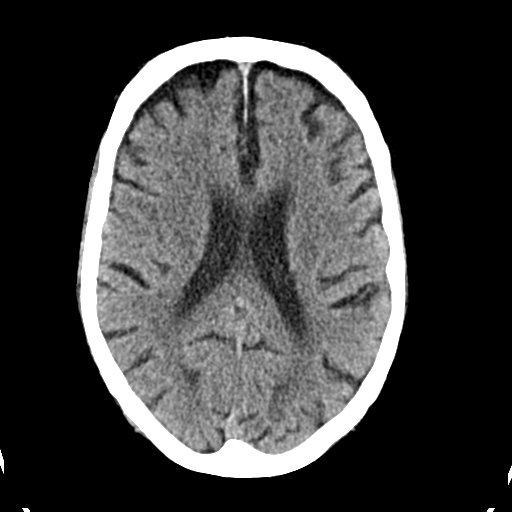
[im 16/30  brain]
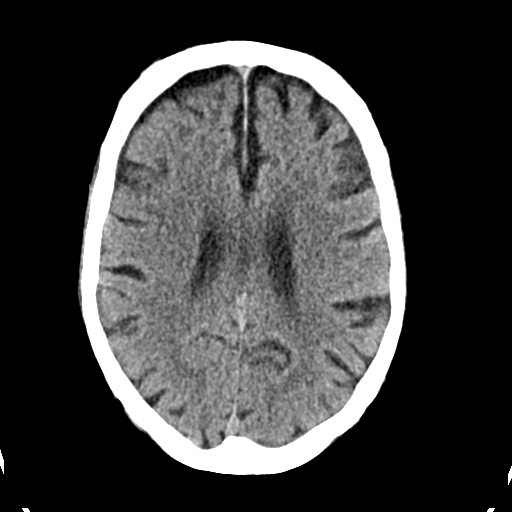
[im 16/30  bone]
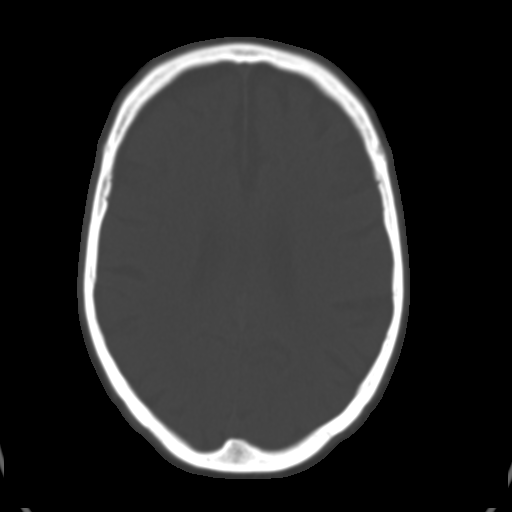
[im 18/30  brain]
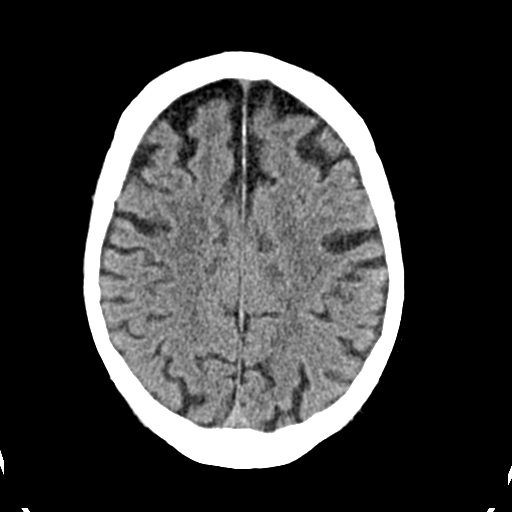
[im 20/30  brain]
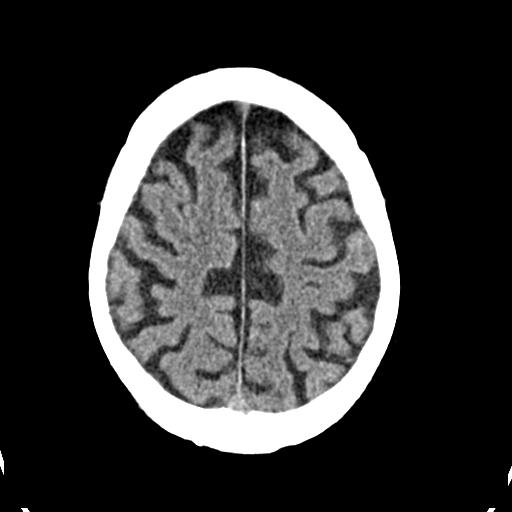
[im 22/30  brain]
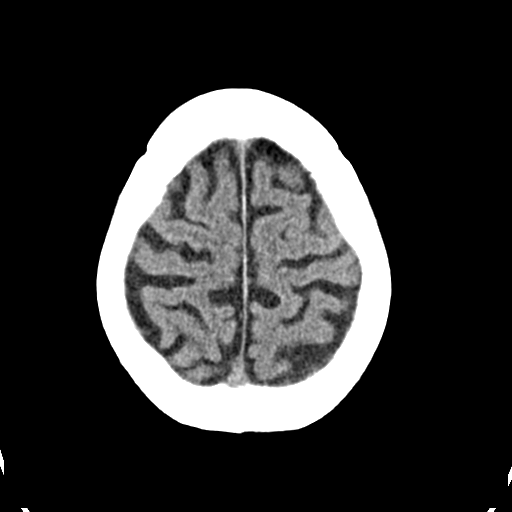
[im 23/30  brain]
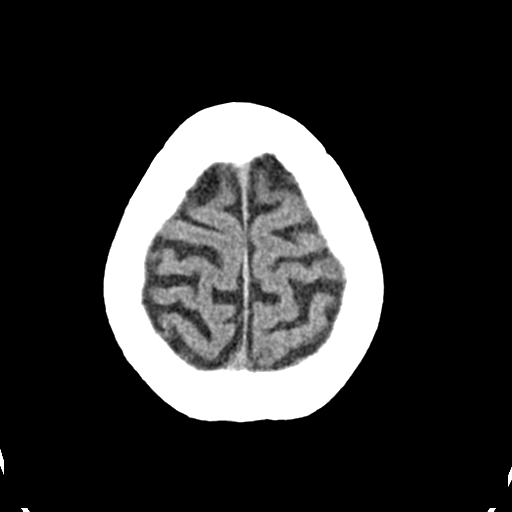
[im 23/30  bone]
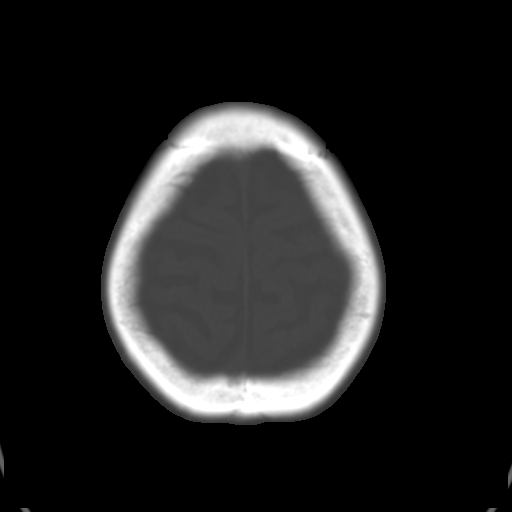
[im 25/30  brain]
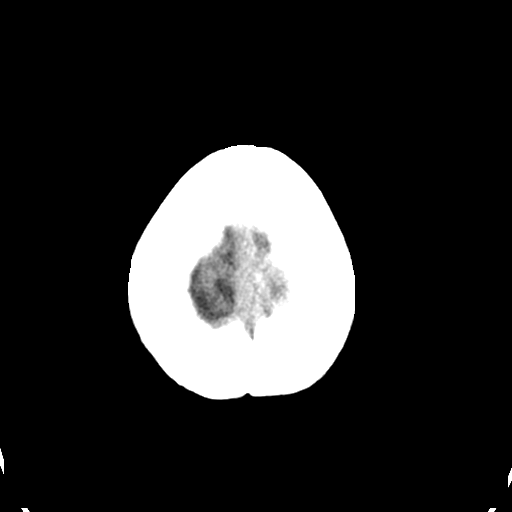
[im 27/30  brain]
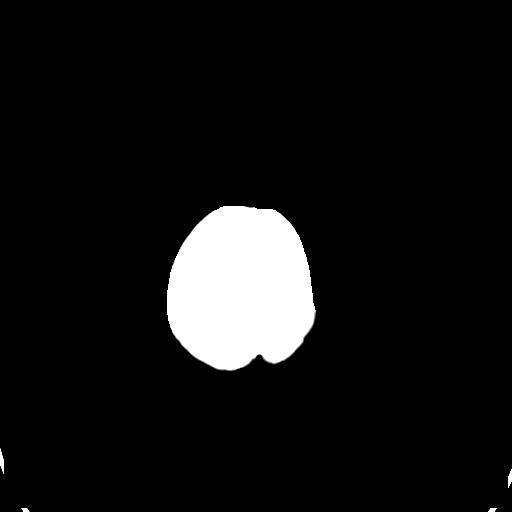
[im 29/30  brain]
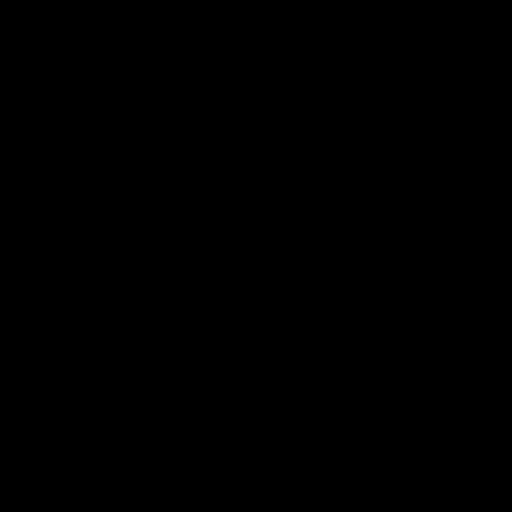

[16 of 30 positions shown; findings below may reference images not displayed]

FINDINGS: Brain: Moderate atrophy with progression. Negative for acute
infarct, hemorrhage, or mass. No fluid collection or shift of the
midline structures.

Vascular: Atherosclerotic calcification. Negative for hyperdense
vessel

Skull: Negative

Sinuses/Orbits: Negative

Other: None
IMPRESSION: No acute intracranial abnormality.  Progressive atrophy since [DATE].

## 2018-07-03 LAB — CUP PACEART REMOTE DEVICE CHECK
Battery Remaining Longevity: 51 mo
Brady Statistic AP VS Percent: 0 %
Brady Statistic AS VP Percent: 67 %
Brady Statistic AS VS Percent: 0 %
Implantable Lead Implant Date: 20101014
Implantable Lead Location: 753859
Implantable Lead Location: 753860
Implantable Lead Model: 5076
Implantable Lead Model: 5076
Lead Channel Pacing Threshold Amplitude: 0.375 V
Lead Channel Pacing Threshold Amplitude: 0.5 V
Lead Channel Pacing Threshold Pulse Width: 0.4 ms
Lead Channel Setting Pacing Amplitude: 1.5 V
Lead Channel Setting Pacing Amplitude: 2.5 V
MDC IDC LEAD IMPLANT DT: 20101014
MDC IDC MSMT BATTERY IMPEDANCE: 1307 Ohm
MDC IDC MSMT BATTERY VOLTAGE: 2.77 V
MDC IDC MSMT LEADCHNL RA IMPEDANCE VALUE: 432 Ohm
MDC IDC MSMT LEADCHNL RA PACING THRESHOLD PULSEWIDTH: 0.4 ms
MDC IDC MSMT LEADCHNL RV IMPEDANCE VALUE: 748 Ohm
MDC IDC PG IMPLANT DT: 20101014
MDC IDC SESS DTM: 20191016190919
MDC IDC SET LEADCHNL RV PACING PULSEWIDTH: 0.4 ms
MDC IDC SET LEADCHNL RV SENSING SENSITIVITY: 5.6 mV
MDC IDC STAT BRADY AP VP PERCENT: 33 %

## 2018-08-02 ENCOUNTER — Encounter: Payer: Self-pay | Admitting: Cardiology

## 2018-08-08 ENCOUNTER — Telehealth: Payer: Self-pay

## 2018-08-08 NOTE — Telephone Encounter (Signed)
Left message for patient to remind of missed remote transmission.  

## 2018-08-09 ENCOUNTER — Ambulatory Visit (INDEPENDENT_AMBULATORY_CARE_PROVIDER_SITE_OTHER): Payer: Medicare Other

## 2018-08-09 DIAGNOSIS — I442 Atrioventricular block, complete: Secondary | ICD-10-CM

## 2018-08-10 LAB — CUP PACEART REMOTE DEVICE CHECK
Battery Impedance: 1387 Ohm
Brady Statistic AP VS Percent: 0 %
Brady Statistic AS VS Percent: 0 %
Date Time Interrogation Session: 20200124232129
Implantable Lead Implant Date: 20101014
Implantable Lead Location: 753859
Lead Channel Impedance Value: 791 Ohm
Lead Channel Pacing Threshold Pulse Width: 0.4 ms
Lead Channel Pacing Threshold Pulse Width: 0.4 ms
Lead Channel Setting Pacing Amplitude: 2.5 V
Lead Channel Setting Pacing Pulse Width: 0.4 ms
Lead Channel Setting Sensing Sensitivity: 5.6 mV
MDC IDC LEAD IMPLANT DT: 20101014
MDC IDC LEAD LOCATION: 753860
MDC IDC MSMT BATTERY REMAINING LONGEVITY: 49 mo
MDC IDC MSMT BATTERY VOLTAGE: 2.77 V
MDC IDC MSMT LEADCHNL RA IMPEDANCE VALUE: 421 Ohm
MDC IDC MSMT LEADCHNL RA PACING THRESHOLD AMPLITUDE: 0.5 V
MDC IDC MSMT LEADCHNL RV PACING THRESHOLD AMPLITUDE: 0.5 V
MDC IDC PG IMPLANT DT: 20101014
MDC IDC SET LEADCHNL RA PACING AMPLITUDE: 1.5 V
MDC IDC STAT BRADY AP VP PERCENT: 28 %
MDC IDC STAT BRADY AS VP PERCENT: 72 %

## 2018-08-12 NOTE — Progress Notes (Signed)
Remote pacemaker transmission.   

## 2018-09-12 DIAGNOSIS — Z961 Presence of intraocular lens: Secondary | ICD-10-CM | POA: Diagnosis not present

## 2018-09-26 DIAGNOSIS — Z79899 Other long term (current) drug therapy: Secondary | ICD-10-CM | POA: Diagnosis not present

## 2018-09-26 DIAGNOSIS — D519 Vitamin B12 deficiency anemia, unspecified: Secondary | ICD-10-CM | POA: Diagnosis not present

## 2018-09-26 DIAGNOSIS — E785 Hyperlipidemia, unspecified: Secondary | ICD-10-CM | POA: Diagnosis not present

## 2018-09-26 DIAGNOSIS — Z0000 Encounter for general adult medical examination without abnormal findings: Secondary | ICD-10-CM | POA: Diagnosis not present

## 2018-10-02 DIAGNOSIS — I6529 Occlusion and stenosis of unspecified carotid artery: Secondary | ICD-10-CM | POA: Diagnosis not present

## 2018-10-02 DIAGNOSIS — I6523 Occlusion and stenosis of bilateral carotid arteries: Secondary | ICD-10-CM | POA: Diagnosis not present

## 2018-10-04 ENCOUNTER — Encounter (HOSPITAL_COMMUNITY): Payer: Medicare Other

## 2018-10-04 ENCOUNTER — Ambulatory Visit: Payer: Medicare Other | Admitting: Family

## 2018-11-11 ENCOUNTER — Ambulatory Visit (INDEPENDENT_AMBULATORY_CARE_PROVIDER_SITE_OTHER): Payer: Medicare Other | Admitting: *Deleted

## 2018-11-11 ENCOUNTER — Other Ambulatory Visit: Payer: Self-pay

## 2018-11-11 DIAGNOSIS — L603 Nail dystrophy: Secondary | ICD-10-CM | POA: Diagnosis not present

## 2018-11-11 DIAGNOSIS — I442 Atrioventricular block, complete: Secondary | ICD-10-CM | POA: Diagnosis not present

## 2018-11-11 DIAGNOSIS — M2012 Hallux valgus (acquired), left foot: Secondary | ICD-10-CM | POA: Diagnosis not present

## 2018-11-11 DIAGNOSIS — E119 Type 2 diabetes mellitus without complications: Secondary | ICD-10-CM | POA: Diagnosis not present

## 2018-11-11 DIAGNOSIS — M2011 Hallux valgus (acquired), right foot: Secondary | ICD-10-CM | POA: Diagnosis not present

## 2018-11-11 DIAGNOSIS — B351 Tinea unguium: Secondary | ICD-10-CM | POA: Diagnosis not present

## 2018-11-12 ENCOUNTER — Telehealth: Payer: Self-pay

## 2018-11-12 NOTE — Telephone Encounter (Signed)
Spoke with patient to remind of missed remote transmission 

## 2018-11-15 LAB — CUP PACEART REMOTE DEVICE CHECK
Battery Impedance: 1577 Ohm
Battery Remaining Longevity: 45 mo
Battery Voltage: 2.77 V
Brady Statistic AP VP Percent: 25 %
Brady Statistic AP VS Percent: 0 %
Brady Statistic AS VP Percent: 75 %
Brady Statistic AS VS Percent: 0 %
Date Time Interrogation Session: 20200501021134
Implantable Lead Implant Date: 20101014
Implantable Lead Implant Date: 20101014
Implantable Lead Location: 753859
Implantable Lead Location: 753860
Implantable Lead Model: 5076
Implantable Lead Model: 5076
Implantable Pulse Generator Implant Date: 20101014
Lead Channel Impedance Value: 427 Ohm
Lead Channel Impedance Value: 856 Ohm
Lead Channel Pacing Threshold Amplitude: 0.5 V
Lead Channel Pacing Threshold Amplitude: 0.5 V
Lead Channel Pacing Threshold Pulse Width: 0.4 ms
Lead Channel Pacing Threshold Pulse Width: 0.4 ms
Lead Channel Setting Pacing Amplitude: 1.5 V
Lead Channel Setting Pacing Amplitude: 2.5 V
Lead Channel Setting Pacing Pulse Width: 0.4 ms
Lead Channel Setting Sensing Sensitivity: 5.6 mV

## 2018-11-18 NOTE — Progress Notes (Signed)
Remote pacemaker transmission.   

## 2018-11-25 ENCOUNTER — Telehealth: Payer: Self-pay | Admitting: Internal Medicine

## 2018-11-25 NOTE — Telephone Encounter (Signed)
New Message             Patient is calling in to see if he can bring in a paper from [facility], patient also wants to know why his appt for tomorrow was cancelled and he was not notified. Pls call to advise.

## 2018-11-25 NOTE — Telephone Encounter (Signed)
Called patient as he was inquiring about his cancelled appt for tomorrow.  Advised patient that he had an appt with vascular vein specialist and need to call them to get his questions answered about his cancelled appt.

## 2018-11-26 ENCOUNTER — Encounter (HOSPITAL_COMMUNITY): Payer: Medicare Other

## 2018-11-26 ENCOUNTER — Ambulatory Visit: Payer: Medicare Other | Admitting: Family

## 2018-12-04 ENCOUNTER — Telehealth: Payer: Self-pay | Admitting: Internal Medicine

## 2018-12-04 NOTE — Telephone Encounter (Signed)
New Message     Pt is calling because he said he needs information sent to the [facility] stating that he is fit to operate a motor vehicle in [place]    Please call

## 2018-12-05 ENCOUNTER — Encounter: Payer: Self-pay | Admitting: Internal Medicine

## 2018-12-05 NOTE — Telephone Encounter (Signed)
To whom it may concern:  Scott [name] is seen in our clinic for complete heart block   He has a functioning pacemaker, most recently assessed 5/20 at which time it was normal  We are available for any further questions   SK

## 2018-12-05 NOTE — Telephone Encounter (Signed)
Pt calling today to discuss the need for a letter to be written on his behalf stating he is fit to drive from a cardiac standpoint. I advised him I would discuss with Dr Caryl Comes as it has almost been a year since he was last seen in the office. He understands I will contact him back with recommendations.

## 2018-12-10 DIAGNOSIS — L570 Actinic keratosis: Secondary | ICD-10-CM | POA: Diagnosis not present

## 2018-12-10 DIAGNOSIS — L218 Other seborrheic dermatitis: Secondary | ICD-10-CM | POA: Diagnosis not present

## 2018-12-10 DIAGNOSIS — L821 Other seborrheic keratosis: Secondary | ICD-10-CM | POA: Diagnosis not present

## 2018-12-10 DIAGNOSIS — D225 Melanocytic nevi of trunk: Secondary | ICD-10-CM | POA: Diagnosis not present

## 2018-12-10 NOTE — Telephone Encounter (Signed)
Pt aware his letter is in the mail on its way to him. He has no additional questions.

## 2018-12-14 DIAGNOSIS — I10 Essential (primary) hypertension: Secondary | ICD-10-CM | POA: Diagnosis not present

## 2018-12-14 DIAGNOSIS — D519 Vitamin B12 deficiency anemia, unspecified: Secondary | ICD-10-CM | POA: Diagnosis not present

## 2019-01-07 DIAGNOSIS — D485 Neoplasm of uncertain behavior of skin: Secondary | ICD-10-CM | POA: Diagnosis not present

## 2019-01-07 DIAGNOSIS — C44319 Basal cell carcinoma of skin of other parts of face: Secondary | ICD-10-CM | POA: Diagnosis not present

## 2019-01-07 DIAGNOSIS — L570 Actinic keratosis: Secondary | ICD-10-CM | POA: Diagnosis not present

## 2019-01-09 ENCOUNTER — Other Ambulatory Visit: Payer: Self-pay

## 2019-01-09 NOTE — Patient Outreach (Signed)
[facility]) Care Management  01/09/2019  Scott [name] 07-Nov-1924 [id number]   Medication Adherence call to Scott [name] [facility] Ident[facility] Verify spoke with patient he is past due on Metformin Er 500 mg patient explain this medication was prescribe a month ago and is not sure if he is to continued taking this medications because doctor told him to take it  for a month,left a message at doctors office to call back ,Scott [name] is showing past due under [place].   [facility] Management Direct Dial [phone]  Fax [phone] [name]

## 2019-02-10 ENCOUNTER — Ambulatory Visit (INDEPENDENT_AMBULATORY_CARE_PROVIDER_SITE_OTHER): Payer: Medicare Other | Admitting: *Deleted

## 2019-02-10 DIAGNOSIS — I442 Atrioventricular block, complete: Secondary | ICD-10-CM

## 2019-02-11 LAB — CUP PACEART REMOTE DEVICE CHECK
Battery Impedance: 1664 Ohm
Battery Remaining Longevity: 42 mo
Battery Voltage: 2.77 V
Brady Statistic AP VP Percent: 25 %
Brady Statistic AP VS Percent: 0 %
Brady Statistic AS VP Percent: 74 %
Brady Statistic AS VS Percent: 0 %
Date Time Interrogation Session: 20200727210930
Implantable Lead Implant Date: 20101014
Implantable Lead Implant Date: 20101014
Implantable Lead Location: 753859
Implantable Lead Location: 753860
Implantable Lead Model: 5076
Implantable Lead Model: 5076
Implantable Pulse Generator Implant Date: 20101014
Lead Channel Impedance Value: 428 Ohm
Lead Channel Impedance Value: 776 Ohm
Lead Channel Pacing Threshold Amplitude: 0.5 V
Lead Channel Pacing Threshold Amplitude: 0.5 V
Lead Channel Pacing Threshold Pulse Width: 0.4 ms
Lead Channel Pacing Threshold Pulse Width: 0.4 ms
Lead Channel Setting Pacing Amplitude: 1.5 V
Lead Channel Setting Pacing Amplitude: 2.5 V
Lead Channel Setting Pacing Pulse Width: 0.4 ms
Lead Channel Setting Sensing Sensitivity: 5.6 mV

## 2019-02-12 DIAGNOSIS — L603 Nail dystrophy: Secondary | ICD-10-CM | POA: Diagnosis not present

## 2019-02-12 DIAGNOSIS — E119 Type 2 diabetes mellitus without complications: Secondary | ICD-10-CM | POA: Diagnosis not present

## 2019-02-24 NOTE — Progress Notes (Signed)
Remote pacemaker transmission.   

## 2019-03-10 DIAGNOSIS — L570 Actinic keratosis: Secondary | ICD-10-CM | POA: Diagnosis not present

## 2019-03-10 DIAGNOSIS — C44319 Basal cell carcinoma of skin of other parts of face: Secondary | ICD-10-CM | POA: Diagnosis not present

## 2019-04-28 DIAGNOSIS — Z23 Encounter for immunization: Secondary | ICD-10-CM | POA: Diagnosis not present

## 2019-05-09 ENCOUNTER — Other Ambulatory Visit: Payer: Self-pay

## 2019-05-09 NOTE — Patient Outreach (Signed)
[facility] [facility]) Care Management  05/09/2019  Scott [name] Apr 20, 1925 [id number]   Medication Adherence call to Scott [name] Telephone call to Patient regarding Medication Adherence unable to reach patient. [name] is showing past due on Pravastatin 20 mg under [place].   [name] Management Direct Dial [phone]  Fax [phone] [email or web address]  .

## 2019-05-13 ENCOUNTER — Other Ambulatory Visit: Payer: Self-pay

## 2019-05-13 ENCOUNTER — Ambulatory Visit: Payer: Medicare Other | Admitting: *Deleted

## 2019-05-13 NOTE — Patient Outreach (Signed)
[place] [facility]) Care Management  05/13/2019  RASHUN MCKEOUGH 28-Nov-1924 [id number]   Medication Adherence call to Mr. Camiel Greger patient did not want to engage patient said he did not have any time.Mr. Wirkkala is showing past due on Pravastatin 20 mg under [place].   [place] Management Direct Dial [phone]  Fax [phone] [email or web address]

## 2019-05-15 LAB — CUP PACEART REMOTE DEVICE CHECK
Battery Impedance: 1717 Ohm
Battery Remaining Longevity: 41 mo
Battery Voltage: 2.77 V
Brady Statistic AP VP Percent: 26 %
Brady Statistic AP VS Percent: 0 %
Brady Statistic AS VP Percent: 74 %
Brady Statistic AS VS Percent: 0 %
Date Time Interrogation Session: 20201029170928
Implantable Lead Implant Date: 20101014
Implantable Lead Implant Date: 20101014
Implantable Lead Location: 753859
Implantable Lead Location: 753860
Implantable Lead Model: 5076
Implantable Lead Model: 5076
Implantable Pulse Generator Implant Date: 20101014
Lead Channel Impedance Value: 417 Ohm
Lead Channel Impedance Value: 850 Ohm
Lead Channel Pacing Threshold Amplitude: 0.5 V
Lead Channel Pacing Threshold Amplitude: 0.5 V
Lead Channel Pacing Threshold Pulse Width: 0.4 ms
Lead Channel Pacing Threshold Pulse Width: 0.4 ms
Lead Channel Sensing Intrinsic Amplitude: 2.8 mV
Lead Channel Setting Pacing Amplitude: 1.5 V
Lead Channel Setting Pacing Amplitude: 2.5 V
Lead Channel Setting Pacing Pulse Width: 0.4 ms
Lead Channel Setting Sensing Sensitivity: 5.6 mV

## 2019-05-28 DIAGNOSIS — Z7984 Long term (current) use of oral hypoglycemic drugs: Secondary | ICD-10-CM | POA: Diagnosis not present

## 2019-05-28 DIAGNOSIS — E119 Type 2 diabetes mellitus without complications: Secondary | ICD-10-CM | POA: Diagnosis not present

## 2019-05-28 DIAGNOSIS — L603 Nail dystrophy: Secondary | ICD-10-CM | POA: Diagnosis not present

## 2019-06-17 DIAGNOSIS — Z9181 History of falling: Secondary | ICD-10-CM | POA: Diagnosis not present

## 2019-06-17 DIAGNOSIS — E7800 Pure hypercholesterolemia, unspecified: Secondary | ICD-10-CM | POA: Diagnosis not present

## 2019-06-17 DIAGNOSIS — E1129 Type 2 diabetes mellitus with other diabetic kidney complication: Secondary | ICD-10-CM | POA: Diagnosis not present

## 2019-09-10 DIAGNOSIS — E119 Type 2 diabetes mellitus without complications: Secondary | ICD-10-CM | POA: Diagnosis not present

## 2019-09-10 DIAGNOSIS — B351 Tinea unguium: Secondary | ICD-10-CM | POA: Diagnosis not present

## 2019-09-10 DIAGNOSIS — M79675 Pain in left toe(s): Secondary | ICD-10-CM | POA: Diagnosis not present

## 2019-09-10 DIAGNOSIS — M79674 Pain in right toe(s): Secondary | ICD-10-CM | POA: Diagnosis not present

## 2019-09-15 ENCOUNTER — Ambulatory Visit (INDEPENDENT_AMBULATORY_CARE_PROVIDER_SITE_OTHER): Payer: Medicare Other | Admitting: *Deleted

## 2019-09-15 DIAGNOSIS — I442 Atrioventricular block, complete: Secondary | ICD-10-CM | POA: Diagnosis not present

## 2019-09-15 LAB — CUP PACEART REMOTE DEVICE CHECK
Battery Impedance: 1833 Ohm
Battery Remaining Longevity: 38 mo
Battery Voltage: 2.76 V
Brady Statistic AP VP Percent: 24 %
Brady Statistic AP VS Percent: 0 %
Brady Statistic AS VP Percent: 75 %
Brady Statistic AS VS Percent: 0 %
Date Time Interrogation Session: 20210227205132
Implantable Lead Implant Date: 20101014
Implantable Lead Implant Date: 20101014
Implantable Lead Location: 753859
Implantable Lead Location: 753860
Implantable Lead Model: 5076
Implantable Lead Model: 5076
Implantable Pulse Generator Implant Date: 20101014
Lead Channel Impedance Value: 417 Ohm
Lead Channel Impedance Value: 750 Ohm
Lead Channel Pacing Threshold Amplitude: 0.5 V
Lead Channel Pacing Threshold Amplitude: 0.5 V
Lead Channel Pacing Threshold Pulse Width: 0.4 ms
Lead Channel Pacing Threshold Pulse Width: 0.4 ms
Lead Channel Setting Pacing Amplitude: 1.5 V
Lead Channel Setting Pacing Amplitude: 2.5 V
Lead Channel Setting Pacing Pulse Width: 0.4 ms
Lead Channel Setting Sensing Sensitivity: 5.6 mV

## 2019-09-15 NOTE — Progress Notes (Signed)
PPM Remote  

## 2019-10-27 DIAGNOSIS — E7800 Pure hypercholesterolemia, unspecified: Secondary | ICD-10-CM | POA: Diagnosis not present

## 2019-10-27 DIAGNOSIS — Z6824 Body mass index (BMI) 24.0-24.9, adult: Secondary | ICD-10-CM | POA: Diagnosis not present

## 2019-10-27 DIAGNOSIS — E1129 Type 2 diabetes mellitus with other diabetic kidney complication: Secondary | ICD-10-CM | POA: Diagnosis not present

## 2019-10-27 DIAGNOSIS — Z79899 Other long term (current) drug therapy: Secondary | ICD-10-CM | POA: Diagnosis not present

## 2019-10-27 DIAGNOSIS — Z0000 Encounter for general adult medical examination without abnormal findings: Secondary | ICD-10-CM | POA: Diagnosis not present

## 2019-11-04 DIAGNOSIS — I6523 Occlusion and stenosis of bilateral carotid arteries: Secondary | ICD-10-CM | POA: Diagnosis not present

## 2019-11-20 ENCOUNTER — Telehealth: Payer: Self-pay

## 2019-11-20 NOTE — Telephone Encounter (Signed)
The pt had a physical and his pcp notice he had a heart murmur. I told him I will let Dr. Marlou Porch know and someone will give him a call back.

## 2019-12-16 ENCOUNTER — Ambulatory Visit (INDEPENDENT_AMBULATORY_CARE_PROVIDER_SITE_OTHER): Payer: Medicare Other | Admitting: *Deleted

## 2019-12-16 DIAGNOSIS — I442 Atrioventricular block, complete: Secondary | ICD-10-CM

## 2019-12-17 DIAGNOSIS — E119 Type 2 diabetes mellitus without complications: Secondary | ICD-10-CM | POA: Diagnosis not present

## 2019-12-17 DIAGNOSIS — Z7984 Long term (current) use of oral hypoglycemic drugs: Secondary | ICD-10-CM | POA: Diagnosis not present

## 2019-12-17 DIAGNOSIS — L603 Nail dystrophy: Secondary | ICD-10-CM | POA: Diagnosis not present

## 2019-12-17 LAB — CUP PACEART REMOTE DEVICE CHECK
Battery Impedance: 1959 Ohm
Battery Remaining Longevity: 36 mo
Battery Voltage: 2.76 V
Brady Statistic AP VP Percent: 25 %
Brady Statistic AP VS Percent: 0 %
Brady Statistic AS VP Percent: 75 %
Brady Statistic AS VS Percent: 0 %
Date Time Interrogation Session: 20210602011231
Implantable Lead Implant Date: 20101014
Implantable Lead Implant Date: 20101014
Implantable Lead Location: 753859
Implantable Lead Location: 753860
Implantable Lead Model: 5076
Implantable Lead Model: 5076
Implantable Pulse Generator Implant Date: 20101014
Lead Channel Impedance Value: 393 Ohm
Lead Channel Impedance Value: 742 Ohm
Lead Channel Pacing Threshold Amplitude: 0.5 V
Lead Channel Pacing Threshold Amplitude: 0.5 V
Lead Channel Pacing Threshold Pulse Width: 0.4 ms
Lead Channel Pacing Threshold Pulse Width: 0.4 ms
Lead Channel Setting Pacing Amplitude: 1.5 V
Lead Channel Setting Pacing Amplitude: 2.5 V
Lead Channel Setting Pacing Pulse Width: 0.4 ms
Lead Channel Setting Sensing Sensitivity: 5.6 mV

## 2019-12-17 NOTE — Progress Notes (Signed)
Remote pacemaker transmission.   

## 2020-01-16 ENCOUNTER — Telehealth: Payer: Self-pay | Admitting: Internal Medicine

## 2020-01-16 ENCOUNTER — Telehealth: Payer: Self-pay | Admitting: Neurology

## 2020-01-16 NOTE — Telephone Encounter (Signed)
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 He only has a month to have all the forms completed. Dr. Amparo Bristol first availability is in January 2022. He states that he now only has 2 weeks left, he has seen most of the other providers he is required to see. Please advise.

## 2020-01-16 NOTE — Telephone Encounter (Signed)
Asking if you will do another letter like you did in 2019?

## 2020-01-16 NOTE — Telephone Encounter (Signed)
Scott [name] is calling stating he has yearly evaluation paperwork he needs filled out by Dr. Caryl Comes in order to continue keeping his driving licenses. Please advise.

## 2020-01-16 NOTE — Telephone Encounter (Signed)
I have not filled out forms for him in the past, the last time I only wrote a letter in 2019. I did not fill out any forms for him in 2020. He can just have PCP fill out forms if he has not had any change in his symptoms or any driving changes.

## 2020-01-20 DIAGNOSIS — Z950 Presence of cardiac pacemaker: Secondary | ICD-10-CM | POA: Insufficient documentation

## 2020-01-20 NOTE — Telephone Encounter (Signed)
I cannot write a letter without seeing him. I have an opening on July 30 at 1:30pm if he can come in then. Thanks

## 2020-01-20 NOTE — Telephone Encounter (Signed)
Appointment scheduled for 01/21/2020 at 1045pm.  Pt is aware.

## 2020-01-21 ENCOUNTER — Encounter: Payer: Medicare Other | Admitting: Internal Medicine

## 2020-01-21 ENCOUNTER — Ambulatory Visit (INDEPENDENT_AMBULATORY_CARE_PROVIDER_SITE_OTHER): Payer: Medicare Other | Admitting: Internal Medicine

## 2020-01-21 ENCOUNTER — Other Ambulatory Visit: Payer: Self-pay

## 2020-01-21 ENCOUNTER — Encounter: Payer: Self-pay | Admitting: Internal Medicine

## 2020-01-21 VITALS — BP 116/68 | HR 66 | Ht 60.0 in | Wt 154.2 lb

## 2020-01-21 DIAGNOSIS — Z950 Presence of cardiac pacemaker: Secondary | ICD-10-CM | POA: Diagnosis not present

## 2020-01-21 DIAGNOSIS — I442 Atrioventricular block, complete: Secondary | ICD-10-CM

## 2020-01-21 DIAGNOSIS — E119 Type 2 diabetes mellitus without complications: Secondary | ICD-10-CM | POA: Diagnosis not present

## 2020-01-21 MED ORDER — FUROSEMIDE 20 MG PO TABS: ORAL_TABLET | ORAL | 0 refills | Status: DC

## 2020-01-21 NOTE — Progress Notes (Signed)
Patient Care Team: Angelina Sheriff, MD as PCP - General (Unknown Physician Specialty)   HPI  Scott [name] is a 86 y.o. male Seen in follow-up for pacemaker 2010. He has complete heart block.    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  Some peripheral edema.  Diet is not salt deplete.  No chest pain.   Date Cr K Hgb  5/12     10.9    4/21 1.3 4.5 10.9   No history of anemia of which he is aware.  Remote catheterization demonstrated no obstructive coronary disease (2010)   Echo 4/ 2016 normal LV funciton  Past Medical History:  Diagnosis Date  . B12 deficiency   . BPH (benign prostatic hyperplasia)   . Carotid artery disease ([place])   . Complete heart block (HCC)    a. s/p MDT dual chamber PPM   . Hyperlipidemia   . Hypertension   . Pain in joint, pelvic region and thigh   . PVD (peripheral vascular disease) (HCC)    LICA 81-44% - PVD    Past Surgical History:  Procedure Laterality Date  . CHOLECYSTECTOMY    . CORONARY ANGIOPLASTY WITH STENT PLACEMENT  11/12/08   No angiographically significant CAD - mid to distal LAD small in caliber, question a degree of vasospasm. LVEDP-36mmHg, aoritc valve stenosis very mild 9.40mmHg.  Marland Kitchen PACEMAKER INSERTION  04/29/09   Medtronic Adapta L implanted by Dr Caryl Comes for AV block    Current Outpatient Medications  Medication Sig Dispense Refill  . Cholecalciferol (VITAMIN D3) 1000 UNITS CAPS Take 1,000 Units by mouth daily.    . ciclopirox (LOPROX) 0.77 % cream as needed.  1  . gabapentin (NEURONTIN) 300 MG capsule TAKE 1 CAPSULE EVERY MORNING AND TAKE 1 CAPSULE AT NOON AND TAKE 2 CAPSULES AT BEDTIME 360 capsule 4  . Glucosamine-Chondroitin-MSM (TRIPLE FLEX PO) Take 1 tablet by mouth 3 (three) times daily.    Marland Kitchen lisinopril (PRINIVIL,ZESTRIL) 10 MG tablet Take 10 mg by mouth daily.    . metFORMIN (GLUCOPHAGE-XR) 500 MG 24 hr tablet Take 1 tablet by mouth at bedtime.    . metoprolol  succinate (TOPROL-XL) 50 MG 24 hr tablet Take 1 tablet by mouth daily.    . pravastatin (PRAVACHOL) 20 MG tablet Take 20 mg by mouth at bedtime.  2  . silodosin (RAPAFLO) 8 MG CAPS capsule Take 8 mg by mouth daily with breakfast.      . vitamin C (ASCORBIC ACID) 500 MG tablet Take 500 mg by mouth daily.      . furosemide (LASIX) 20 MG tablet Take 1 tablet by mouth daily x 5 days then may take as needed. 30 tablet 0   No current facility-administered medications for this visit.    No Known Allergies  Review of Systems negative except from HPI and PMH  Physical Exam There were no vitals taken for this visit. Well developed and well nourished in no acute distress HENT normal Neck supple with JVP-8-10 Clear Device pocket well healed; without hematoma or erythema.  There is no tethering  Regular rate and rhythm, no gallop 2/6 murmur Abd-soft with active BS No Clubbing cyanosis 2+ edema Skin-warm and dry A & Oriented  Grossly normal sensory and motor function  ECG sinus with P synchronous pacing  Assessment and  Plan  Complete heart block  Pacemaker-Medtronic     Hypertension  Congestive heart failure-acute/chronic  Anemia-chronic    The patient device function is normal.  He has volume overload.  It is mildly-modestly symptomatic.  We will undertake gentle diuresis begin furosemide 20 mg daily for just 5 days.  It is unlikely to be aggravated by his anemia which is mild; it is unclear if that is chronically low or intermittently low.  The last data we have is 2012.  I have asked him to follow-up with his PCP regarding his anemia.  Another possibility would include pacemaker associated cardiomyopathy.  If his symptoms continue following diuresis, we will undertake an echocardiogram.  He is amazingly active for 89.  The chart has been misread someplace as it describes stent.  Going back to his catheterization report 2010 no obstructive coronary disease was noted.  We will  discontinue his aspirin.

## 2020-01-21 NOTE — Patient Instructions (Signed)
Medication Instructions:  Your physician has recommended you make the following change in your medication:   Stop Aspirin  Begin taking Furosemide 20mg  - 1 tablet by mouth daily x 5 days, then may take as needed.   Labwork: None ordered.  Testing/Procedures: None ordered.  Follow-Up: Your physician wants you to follow-up in: 12 months with Dr Gari Crown will receive a reminder letter in the mail two months in advance. If you don't receive a letter, please call our office to schedule the follow-up appointment.  Remote monitoring is used to monitor your Pacemaker of ICD from home. This monitoring reduces the number of office visits required to check your device to one time per year. It allows [place] to keep an eye on the functioning of your device to ensure it is working properly.   Any Other Special Instructions Will Be Listed Below (If Applicable).  If you need a refill on your cardiac medications before your next appointment, please call your pharmacy.

## 2020-01-21 NOTE — Telephone Encounter (Signed)
Pt called an informed that Dr Delice Lesch can not write a letter unless he is seen in the office pt was given a time for July 30th at 1:30 pt refused stated that he does not need to be seen at this time, if things change he will call back and try to get another time for an appointment

## 2020-02-16 ENCOUNTER — Other Ambulatory Visit: Payer: Self-pay | Admitting: Internal Medicine

## 2020-04-12 ENCOUNTER — Ambulatory Visit (INDEPENDENT_AMBULATORY_CARE_PROVIDER_SITE_OTHER): Payer: Medicare Other | Admitting: Emergency Medicine

## 2020-04-12 DIAGNOSIS — I442 Atrioventricular block, complete: Secondary | ICD-10-CM

## 2020-04-12 LAB — CUP PACEART REMOTE DEVICE CHECK
Battery Impedance: 2077 Ohm
Battery Remaining Longevity: 34 mo
Battery Voltage: 2.76 V
Brady Statistic AP VP Percent: 21 %
Brady Statistic AP VS Percent: 0 %
Brady Statistic AS VP Percent: 79 %
Brady Statistic AS VS Percent: 0 %
Date Time Interrogation Session: 20210925153111
Implantable Lead Implant Date: 20101014
Implantable Lead Implant Date: 20101014
Implantable Lead Location: 753859
Implantable Lead Location: 753860
Implantable Lead Model: 5076
Implantable Lead Model: 5076
Implantable Pulse Generator Implant Date: 20101014
Lead Channel Impedance Value: 407 Ohm
Lead Channel Impedance Value: 807 Ohm
Lead Channel Pacing Threshold Amplitude: 0.5 V
Lead Channel Pacing Threshold Amplitude: 0.5 V
Lead Channel Pacing Threshold Pulse Width: 0.4 ms
Lead Channel Pacing Threshold Pulse Width: 0.4 ms
Lead Channel Setting Pacing Amplitude: 1.5 V
Lead Channel Setting Pacing Amplitude: 2.5 V
Lead Channel Setting Pacing Pulse Width: 0.4 ms
Lead Channel Setting Sensing Sensitivity: 5.6 mV

## 2020-04-14 NOTE — Progress Notes (Signed)
Remote pacemaker transmission.   

## 2020-06-14 DIAGNOSIS — E119 Type 2 diabetes mellitus without complications: Secondary | ICD-10-CM | POA: Diagnosis not present

## 2020-06-14 DIAGNOSIS — L603 Nail dystrophy: Secondary | ICD-10-CM | POA: Diagnosis not present

## 2020-06-29 ENCOUNTER — Other Ambulatory Visit: Payer: Self-pay

## 2020-06-29 MED ORDER — FUROSEMIDE 20 MG PO TABS: ORAL_TABLET | ORAL | 7 refills | Status: DC

## 2020-10-11 ENCOUNTER — Ambulatory Visit (INDEPENDENT_AMBULATORY_CARE_PROVIDER_SITE_OTHER): Payer: Medicare Other

## 2020-10-11 DIAGNOSIS — I442 Atrioventricular block, complete: Secondary | ICD-10-CM

## 2020-10-12 LAB — CUP PACEART REMOTE DEVICE CHECK
Battery Impedance: 2147 Ohm
Battery Remaining Longevity: 33 mo
Battery Voltage: 2.75 V
Brady Statistic AP VP Percent: 21 %
Brady Statistic AP VS Percent: 0 %
Brady Statistic AS VP Percent: 79 %
Brady Statistic AS VS Percent: 0 %
Date Time Interrogation Session: 20220329105931
Implantable Lead Implant Date: 20101014
Implantable Lead Implant Date: 20101014
Implantable Lead Location: 753859
Implantable Lead Location: 753860
Implantable Lead Model: 5076
Implantable Lead Model: 5076
Implantable Pulse Generator Implant Date: 20101014
Lead Channel Impedance Value: 445 Ohm
Lead Channel Impedance Value: 773 Ohm
Lead Channel Pacing Threshold Amplitude: 0.5 V
Lead Channel Pacing Threshold Amplitude: 0.625 V
Lead Channel Pacing Threshold Pulse Width: 0.4 ms
Lead Channel Pacing Threshold Pulse Width: 0.4 ms
Lead Channel Setting Pacing Amplitude: 1.5 V
Lead Channel Setting Pacing Amplitude: 2.5 V
Lead Channel Setting Pacing Pulse Width: 0.4 ms
Lead Channel Setting Sensing Sensitivity: 5.6 mV

## 2020-10-18 DIAGNOSIS — L603 Nail dystrophy: Secondary | ICD-10-CM | POA: Diagnosis not present

## 2020-10-18 DIAGNOSIS — E119 Type 2 diabetes mellitus without complications: Secondary | ICD-10-CM | POA: Diagnosis not present

## 2020-10-20 NOTE — Progress Notes (Signed)
Remote pacemaker transmission.   

## 2020-11-05 DIAGNOSIS — Z0000 Encounter for general adult medical examination without abnormal findings: Secondary | ICD-10-CM | POA: Diagnosis not present

## 2020-11-05 DIAGNOSIS — M810 Age-related osteoporosis without current pathological fracture: Secondary | ICD-10-CM | POA: Diagnosis not present

## 2020-11-05 DIAGNOSIS — E1129 Type 2 diabetes mellitus with other diabetic kidney complication: Secondary | ICD-10-CM | POA: Diagnosis not present

## 2020-11-05 DIAGNOSIS — M8580 Other specified disorders of bone density and structure, unspecified site: Secondary | ICD-10-CM | POA: Diagnosis not present

## 2020-11-05 DIAGNOSIS — Z79899 Other long term (current) drug therapy: Secondary | ICD-10-CM | POA: Diagnosis not present

## 2020-11-05 DIAGNOSIS — E7800 Pure hypercholesterolemia, unspecified: Secondary | ICD-10-CM | POA: Diagnosis not present

## 2020-11-22 DIAGNOSIS — E1129 Type 2 diabetes mellitus with other diabetic kidney complication: Secondary | ICD-10-CM | POA: Diagnosis not present

## 2021-01-07 ENCOUNTER — Other Ambulatory Visit: Payer: Self-pay

## 2021-01-07 MED ORDER — FUROSEMIDE 20 MG PO TABS: ORAL_TABLET | ORAL | 1 refills | Status: DC

## 2021-01-19 DIAGNOSIS — E119 Type 2 diabetes mellitus without complications: Secondary | ICD-10-CM | POA: Diagnosis not present

## 2021-01-19 DIAGNOSIS — B351 Tinea unguium: Secondary | ICD-10-CM | POA: Diagnosis not present

## 2021-02-01 ENCOUNTER — Encounter: Payer: Self-pay | Admitting: Internal Medicine

## 2021-02-01 ENCOUNTER — Ambulatory Visit (INDEPENDENT_AMBULATORY_CARE_PROVIDER_SITE_OTHER): Payer: Medicare Other | Admitting: Internal Medicine

## 2021-02-01 ENCOUNTER — Other Ambulatory Visit: Payer: Self-pay

## 2021-02-01 ENCOUNTER — Other Ambulatory Visit: Payer: Self-pay | Admitting: Internal Medicine

## 2021-02-01 VITALS — BP 110/70 | HR 68 | Ht 60.0 in | Wt 154.0 lb

## 2021-02-01 DIAGNOSIS — Z950 Presence of cardiac pacemaker: Secondary | ICD-10-CM | POA: Diagnosis not present

## 2021-02-01 DIAGNOSIS — I442 Atrioventricular block, complete: Secondary | ICD-10-CM | POA: Diagnosis not present

## 2021-02-01 MED ORDER — FUROSEMIDE 40 MG PO TABS: ORAL_TABLET | ORAL | 3 refills | Status: DC

## 2021-02-01 NOTE — Patient Instructions (Signed)
Medication Instructions:  Your physician has recommended you make the following change in your medication:   Increase your Lasix to 40mg  - 1 tablet by mouth every other day. Monday, Wednesday and Friday.  *If you need a refill on your cardiac medications before your next appointment, please call your pharmacy*   Lab Work: None ordered.  If you have labs (blood work) drawn today and your tests are completely normal, you will receive your results only by: [place] (if you have MyChart) OR A paper copy in the mail If you have any lab test that is abnormal or we need to change your treatment, we will call you to review the results.   Testing/Procedures: None ordered.    Follow-Up: At [facility], you and your health needs are our priority.  As part of our continuing mission to provide you with exceptional heart care, we have created designated Provider Care Teams.  These Care Teams include your primary Cardiologist (physician) and Advanced Practice Providers (APPs -  Physician Assistants and Nurse Practitioners) who all work together to provide you with the care you need, when you need it.  We recommend signing up for the patient portal called "MyChart".  Sign up information is provided on this After Visit Summary.  MyChart is used to connect with patients for Virtual Visits (Telemedicine).  Patients are able to view lab/test results, encounter notes, upcoming appointments, etc.  Non-urgent messages can be sent to your provider as well.   To learn more about what you can do with MyChart, go to [email or web address].    Your next appointment:   12 month(s)  The format for your next appointment:   In Person  Provider:   Virl Axe, MD

## 2021-02-01 NOTE — Progress Notes (Signed)
Patient ID: Scott [name], male   DOB: October 20, 1924, 87 y.o.   MRN: [id number]      Patient Care Team: Angelina Sheriff, MD as PCP - General (Unknown Physician Specialty)   HPI  Scott [name] is a 87 y.o. male Seen in follow-up for pacemaker 2010. He has complete heart block.     mia of which he is aware.  Remote catheterization demonstrated no obstructive coronary disease (2010)   Echo 4/ 2016 normal LV funciton   Today, The patient denies chest pain, shortness of breath, nocturnal dyspnea, orthopnea.  There have been no palpitations, lightheadedness or syncope.  Complains of peripheral edema , mild dyspnea on exertion is the same   He is not as active as he likes . He is active when he goes to the grocery store   The use of daily furosemide 20 mg has had no impact on his edema  09/2020 He quit his job at the [name] TEST EF%   4/10 MyoView   %   4/16 Echo   60-65% %         Date Cr K Hgb  5/12     10.9    4/21 1.3 4.5 10.9    Past Medical History:  Diagnosis Date   B12 deficiency    BPH (benign prostatic hyperplasia)    Carotid artery disease (HCC)    Complete heart block (HCC)    a. s/p MDT dual chamber PPM    Hyperlipidemia    Hypertension    Pain in joint, pelvic region and thigh    PVD (peripheral vascular disease) ([place])    LICA 48-18% - PVD    Past Surgical History:  Procedure Laterality Date   CHOLECYSTECTOMY     CORONARY ANGIOPLASTY WITH STENT PLACEMENT  11/12/08   No angiographically significant CAD - mid to distal LAD small in caliber, question a degree of vasospasm. LVEDP-38mmHg, aoritc valve stenosis very mild 9.8mmHg.   PACEMAKER INSERTION  04/29/09   Medtronic Adapta L implanted by Dr Caryl Comes for AV block    Current Outpatient Medications  Medication Sig Dispense Refill   Cholecalciferol (VITAMIN D3) 1000 UNITS CAPS Take 1,000 Units by mouth daily.     ciclopirox (LOPROX) 0.77 % cream as needed.  1   furosemide (LASIX)  20 MG tablet Take 1 tablet by mouth daily as needed. Please keep upcoming appt in July 2022 with Dr. Caryl Comes before anymore refills. Thank you 30 tablet 1   gabapentin (NEURONTIN) 300 MG capsule TAKE 1 CAPSULE EVERY MORNING AND TAKE 1 CAPSULE AT NOON AND TAKE 2 CAPSULES AT BEDTIME 360 capsule 4   Glucosamine-Chondroitin-MSM (TRIPLE FLEX PO) Take 1 tablet by mouth 3 (three) times daily.     lisinopril (PRINIVIL,ZESTRIL) 10 MG tablet Take 10 mg by mouth daily.     metFORMIN (GLUCOPHAGE-XR) 500 MG 24 hr tablet Take 1 tablet by mouth at bedtime.     metoprolol succinate (TOPROL-XL) 50 MG 24 hr tablet Take 1 tablet by mouth daily.     pravastatin (PRAVACHOL) 20 MG tablet Take 20 mg by mouth at bedtime.  2   silodosin (RAPAFLO) 8 MG CAPS capsule Take 8 mg by mouth daily with breakfast.       vitamin C (ASCORBIC ACID) 500 MG tablet Take 500 mg by mouth daily.       No current facility-administered medications for this visit.    No Known Allergies  Review of Systems  negative except from HPI and PMH  Physical Exam: BP 110/70 (BP Location: Left Arm, Patient Position: Sitting, Cuff Size: Normal)   Pulse (!) 135   Ht 5' (1.524 m)   Wt 154 lb (69.9 kg)   SpO2 97%   BMI 30.08 kg/m  Well developed and well nourished in no acute distress HENT normal Neck supple with JVP-7 Lungs Clear Device pocket well healed; without hematoma or erythema.  There is no tethering  Regular rate and rhythm, No gallop and  No  murmur Abd-soft with active BS No Clubbing cyanosis 1+ edema Skin-warm and dry A & Oriented  Grossly normal sensory and motor function  ECG: Sinus with P synchronous pacing at 68 Intervals 18/12/42   Assessment and  Plan:  Complete heart block  Pacemaker-Medtronic     Hypertension  Congestive heart failure-pEF chronic   i mild peripheral edema.  Furosemide 20 daily is having no impact.  We will change it to 40 every other day.  Blood pressure is well controlled.  Continue on  metoprolol 50 and lisinopril 10.      I,Stephanie Williams,acting as a Education administrator for Virl Axe, MD.,have documented all relevant documentation on the behalf of Virl Axe, MD,as directed by  Virl Axe, MD while in the presence of Virl Axe, MD.   I, Virl Axe, MD, have reviewed all documentation for this visit. The documentation on 02/01/21 for the exam, diagnosis, procedures, and orders are all accurate and complete.

## 2021-03-15 DIAGNOSIS — E119 Type 2 diabetes mellitus without complications: Secondary | ICD-10-CM | POA: Diagnosis not present

## 2021-03-30 ENCOUNTER — Telehealth: Payer: Self-pay | Admitting: *Deleted

## 2021-03-30 NOTE — Telephone Encounter (Signed)
   [facility] Pre-operative Risk Assessment    Patient Name: Scott [name]  DOB: [birth year] MRN: [id number]  HEARTCARE STAFF:  - IMPORTANT!!!!!! Under Visit Info/Reason for Call, type in Other and utilize the format Clearance MM/DD/YY or Clearance TBD. Do not use dashes or single digits. - Please review there is not already an duplicate clearance open for this procedure. - If request is for dental extraction, please clarify the # of teeth to be extracted. - If the patient is currently at the dentist's office, call Pre-Op Callback Staff (MA/nurse) to input urgent request.  - If the patient is not currently in the dentist office, please route to the Pre-Op pool.  Request for surgical clearance:  What type of surgery is being performed? Extraction of 2 teeth  When is this surgery scheduled? TBD  What type of clearance is required (medical clearance vs. Pharmacy clearance to hold med vs. Both)? Medical  Are there any medications that need to be held prior to surgery and how long? None listed  Practice name and name of physician performing surgery? [facility]  What is the office phone number? [phone]   7.   What is the office fax number? [phone]  8.   Anesthesia type (None, local, MAC, general) ? Local   Rashan Patient L 03/30/2021, 12:29 PM  _________________________________________________________________   (provider comments below)

## 2021-03-30 NOTE — Telephone Encounter (Signed)
   Patient Name: Scott [name]  DOB: June 24, 1925 MRN: [id number]  Primary Cardiologist: Virl Axe, MD  Chart reviewed as part of pre-operative protocol coverage.   Dental extractions of one or two teeth are considered low risk procedures per guidelines and generally do not require any specific cardiac clearance. It is also generally accepted that for extractions of 1 or 2 teeth and dental cleanings, there is no need to interrupt blood thinner therapy.   SBE prophylaxis is not required for the patient from a cardiac standpoint.  I will route this recommendation to the requesting party via Epic fax function and remove from pre-op pool.  Please call with questions.  Ledora Bottcher, PA 03/30/2021, 4:46 PM

## 2021-04-07 DIAGNOSIS — R110 Nausea: Secondary | ICD-10-CM | POA: Diagnosis not present

## 2021-04-07 DIAGNOSIS — D649 Anemia, unspecified: Secondary | ICD-10-CM | POA: Diagnosis not present

## 2021-04-07 DIAGNOSIS — Z6824 Body mass index (BMI) 24.0-24.9, adult: Secondary | ICD-10-CM | POA: Diagnosis not present

## 2021-04-13 DIAGNOSIS — E1129 Type 2 diabetes mellitus with other diabetic kidney complication: Secondary | ICD-10-CM | POA: Diagnosis not present

## 2021-04-27 DIAGNOSIS — E1121 Type 2 diabetes mellitus with diabetic nephropathy: Secondary | ICD-10-CM | POA: Diagnosis not present

## 2021-04-27 DIAGNOSIS — B351 Tinea unguium: Secondary | ICD-10-CM | POA: Diagnosis not present

## 2021-07-05 ENCOUNTER — Ambulatory Visit (INDEPENDENT_AMBULATORY_CARE_PROVIDER_SITE_OTHER): Payer: Medicare Other

## 2021-07-05 DIAGNOSIS — I442 Atrioventricular block, complete: Secondary | ICD-10-CM

## 2021-07-06 LAB — CUP PACEART REMOTE DEVICE CHECK
Battery Impedance: 2967 Ohm
Battery Remaining Longevity: 22 mo
Battery Voltage: 2.73 V
Brady Statistic AP VP Percent: 32 %
Brady Statistic AP VS Percent: 0 %
Brady Statistic AS VP Percent: 68 %
Brady Statistic AS VS Percent: 0 %
Date Time Interrogation Session: 20221221122115
Implantable Lead Implant Date: 20101014
Implantable Lead Implant Date: 20101014
Implantable Lead Location: 753859
Implantable Lead Location: 753860
Implantable Lead Model: 5076
Implantable Lead Model: 5076
Implantable Pulse Generator Implant Date: 20101014
Lead Channel Impedance Value: 427 Ohm
Lead Channel Impedance Value: 650 Ohm
Lead Channel Pacing Threshold Amplitude: 0.5 V
Lead Channel Pacing Threshold Amplitude: 0.625 V
Lead Channel Pacing Threshold Pulse Width: 0.4 ms
Lead Channel Pacing Threshold Pulse Width: 0.4 ms
Lead Channel Setting Pacing Amplitude: 1.5 V
Lead Channel Setting Pacing Amplitude: 2.5 V
Lead Channel Setting Pacing Pulse Width: 0.4 ms
Lead Channel Setting Sensing Sensitivity: 5.6 mV

## 2021-07-14 NOTE — Progress Notes (Signed)
Remote pacemaker transmission.   

## 2021-07-26 DIAGNOSIS — Z6824 Body mass index (BMI) 24.0-24.9, adult: Secondary | ICD-10-CM | POA: Diagnosis not present

## 2021-07-26 DIAGNOSIS — E1129 Type 2 diabetes mellitus with other diabetic kidney complication: Secondary | ICD-10-CM | POA: Diagnosis not present

## 2021-07-26 DIAGNOSIS — I459 Conduction disorder, unspecified: Secondary | ICD-10-CM | POA: Diagnosis not present

## 2021-07-26 DIAGNOSIS — D649 Anemia, unspecified: Secondary | ICD-10-CM | POA: Diagnosis not present

## 2021-07-26 DIAGNOSIS — I10 Essential (primary) hypertension: Secondary | ICD-10-CM | POA: Diagnosis not present

## 2021-07-26 DIAGNOSIS — E7800 Pure hypercholesterolemia, unspecified: Secondary | ICD-10-CM | POA: Diagnosis not present

## 2021-07-27 ENCOUNTER — Encounter: Payer: Self-pay | Admitting: Neurology

## 2021-07-29 ENCOUNTER — Telehealth: Payer: Self-pay | Admitting: Internal Medicine

## 2021-07-29 NOTE — Telephone Encounter (Signed)
Left message requesting  call back.

## 2021-07-29 NOTE — Telephone Encounter (Signed)
Pt c/o BP issue: STAT if pt c/o blurred vision, one-sided weakness or slurred speech  1. What are your last 5 BP readings?  07/25/21: 135/65  07/28/21: 175/85 07/29/21: 185/100  2. Are you having any other symptoms (ex. Dizziness, headache, blurred vision, passed out)? no  3. What is your BP issue? Patient is concerned because his BP was good on Monday when he went to his other MD. Now his BP is high and he is worried something is going on

## 2021-08-01 NOTE — Telephone Encounter (Signed)
Called and spoke to patient regarding concerns with high blood pressure. Pt confirmed the following readings: 07/25/21: 135/65  07/28/21: 175/85 07/29/21: 185/100.  Pt states that he was concerned that his pressure was going up when it had been good. Pt got his daughter's BP cuff and began using it and since then has had normal readings for him. Pt reports that over the last 2 days his systolic readings have ranged from 403-979 and diastolic between 53-69. Pt denies experiencing any negative symptoms and believes it to have been the cuff. No needs expressed at this time. Judson Roch, RN

## 2021-08-03 DIAGNOSIS — L603 Nail dystrophy: Secondary | ICD-10-CM | POA: Diagnosis not present

## 2021-08-03 DIAGNOSIS — M2012 Hallux valgus (acquired), left foot: Secondary | ICD-10-CM | POA: Diagnosis not present

## 2021-08-03 DIAGNOSIS — M2011 Hallux valgus (acquired), right foot: Secondary | ICD-10-CM | POA: Diagnosis not present

## 2021-08-03 DIAGNOSIS — E119 Type 2 diabetes mellitus without complications: Secondary | ICD-10-CM | POA: Diagnosis not present

## 2021-08-03 NOTE — Progress Notes (Signed)
Electrophysiology Office Note Date: 08/03/2021  ID:  Scott [name], DOB Dec 10, 1924, MRN [id number]  PCP: Angelina Sheriff, MD Primary Cardiologist: Virl Axe, MD Electrophysiologist: Virl Axe, MD   CC: Pacemaker follow-up  Scott [name] is a 88 y.o. male seen today for Virl Axe, MD for routine electrophysiology followup.  Since last being seen in our clinic the patient reports doing very well. Still driving with yearly exams.  he denies chest pain, palpitations, dyspnea, PND, orthopnea, nausea, vomiting, dizziness, syncope, edema, weight gain, or early satiety.  Device History: Medtronic Dual Chamber PPM implanted 2010 for CHB  Past Medical History:  Diagnosis Date   B12 deficiency    BPH (benign prostatic hyperplasia)    Carotid artery disease (HCC)    Complete heart block ([place])    a. s/p MDT dual chamber PPM    Hyperlipidemia    Hypertension    Pain in joint, pelvic region and thigh    PVD (peripheral vascular disease) ([place])    LICA 44-01% - PVD   Past Surgical History:  Procedure Laterality Date   CHOLECYSTECTOMY     CORONARY ANGIOPLASTY WITH STENT PLACEMENT  11/12/08   No angiographically significant CAD - mid to distal LAD small in caliber, question a degree of vasospasm. LVEDP-51mmHg, aoritc valve stenosis very mild 9.77mmHg.   PACEMAKER INSERTION  04/29/09   Medtronic Adapta L implanted by Dr Caryl Comes for AV block    Current Outpatient Medications  Medication Sig Dispense Refill   Cholecalciferol (VITAMIN D3) 1000 UNITS CAPS Take 1,000 Units by mouth daily.     ciclopirox (LOPROX) 0.77 % cream as needed.  1   furosemide (LASIX) 40 MG tablet Take 1 - 40mg  tablet by mouth on Monday, Wednesday and Friday each week. 45 tablet 3   Glucosamine-Chondroitin-MSM (TRIPLE FLEX PO) Take 1 tablet by mouth 3 (three) times daily.     lisinopril (PRINIVIL,ZESTRIL) 10 MG tablet Take 10 mg by mouth daily.     metFORMIN (GLUCOPHAGE-XR) 500 MG 24 hr tablet Take 1  tablet by mouth at bedtime.     metoprolol succinate (TOPROL-XL) 50 MG 24 hr tablet Take 1 tablet by mouth daily.     pravastatin (PRAVACHOL) 20 MG tablet Take 20 mg by mouth at bedtime.  2   vitamin C (ASCORBIC ACID) 500 MG tablet Take 500 mg by mouth daily.       No current facility-administered medications for this visit.    Allergies:   Patient has no known allergies.   Social History: Social History   Socioeconomic History   Marital status: Married    Spouse name: Not on file   Number of children: 5   Years of education: masters +   Highest education level: 10th grade  Occupational History   Occupation: Security   Tobacco Use   Smoking status: Never   Smokeless tobacco: Never  Vaping Use   Vaping Use: Never used  Substance and Sexual Activity   Alcohol use: No    Alcohol/week: 0.0 standard drinks   Drug use: No   Sexual activity: Not on file  Other Topics Concern   Not on file  Social History Narrative   2 caffeine drinks daily.  Lives with wife in a one story home.  Has 5 daughters.  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  Education: masters +.   Social Determinants of Health   Financial Resource Strain: Not on file  Food Insecurity: Not on  file  Transportation Needs: Not on file  Physical Activity: Not on file  Stress: Not on file  Social Connections: Not on file  Intimate Partner Violence: Not on file    Family History: No family history on file.   Review of Systems: All other systems reviewed and are otherwise negative except as noted above.  Physical Exam: There were no vitals filed for this visit.   GEN- The patient is well appearing, alert and oriented x 3 today.   HEENT: normocephalic, atraumatic; sclera clear, conjunctiva pink; hearing intact; oropharynx clear; neck supple  Lungs- Clear to ausculation bilaterally, normal work of breathing.  No wheezes, rales, rhonchi Heart- Regular rate and rhythm, no murmurs, rubs or gallops   GI- soft, non-tender, non-distended, bowel sounds present  Extremities- no clubbing or cyanosis. No edema MS- no significant deformity or atrophy Skin- warm and dry, no rash or lesion; PPM pocket well healed Psych- euthymic mood, full affect Neuro- strength and sensation are intact  PPM Interrogation- reviewed in detail today,  See PACEART report  EKG:  EKG is not ordered today.   Recent Labs: No results found for requested labs within last 8760 hours.   Wt Readings from Last 3 Encounters:  02/01/21 154 lb (69.9 kg)  01/21/20 154 lb 3.2 oz (69.9 kg)  02/13/18 158 lb (71.7 kg)     Other studies Reviewed: Additional studies/ records that were reviewed today include: Previous EP office notes, Previous remote checks, Most recent labwork.   Assessment and Plan:  Complete heart block   Pacemaker-Medtronic      Hypertension   Congestive heart failure-pEF chronic  Continue lasix 40 mg every other day  BP well controlled on metoprolol and lisinopril  PPM function normal  He has no medical contraindications to driving; I can not otherwise speak to his fitness for driving.   Current medicines are reviewed at length with the patient today.    His home monitor has ceased function. Set up with new one today in office.   Disposition:   Follow up with Dr. Caryl Comes in 12 months    Signed, Shirley Friar, PA-C  08/03/2021 9:37 PM  [facility] [address] [place]  [address] [phone] (office) [phone] (fax)

## 2021-08-04 ENCOUNTER — Encounter: Payer: Self-pay | Admitting: Student

## 2021-08-04 ENCOUNTER — Other Ambulatory Visit: Payer: Self-pay

## 2021-08-04 ENCOUNTER — Ambulatory Visit: Payer: Medicare Other | Admitting: Student

## 2021-08-04 VITALS — BP 114/64 | HR 81 | Ht 63.0 in | Wt 149.0 lb

## 2021-08-04 DIAGNOSIS — I5032 Chronic diastolic (congestive) heart failure: Secondary | ICD-10-CM | POA: Diagnosis not present

## 2021-08-04 DIAGNOSIS — I442 Atrioventricular block, complete: Secondary | ICD-10-CM

## 2021-08-04 DIAGNOSIS — I10 Essential (primary) hypertension: Secondary | ICD-10-CM | POA: Diagnosis not present

## 2021-08-04 DIAGNOSIS — Z950 Presence of cardiac pacemaker: Secondary | ICD-10-CM | POA: Diagnosis not present

## 2021-08-04 LAB — CUP PACEART INCLINIC DEVICE CHECK
Battery Impedance: 3050 Ohm
Battery Remaining Longevity: 21 mo
Battery Voltage: 2.73 V
Brady Statistic AP VP Percent: 31 %
Brady Statistic AP VS Percent: 0 %
Brady Statistic AS VP Percent: 69 %
Brady Statistic AS VS Percent: 0 %
Date Time Interrogation Session: 20230119095647
Implantable Lead Implant Date: 20101014
Implantable Lead Implant Date: 20101014
Implantable Lead Location: 753859
Implantable Lead Location: 753860
Implantable Lead Model: 5076
Implantable Lead Model: 5076
Implantable Pulse Generator Implant Date: 20101014
Lead Channel Impedance Value: 410 Ohm
Lead Channel Impedance Value: 639 Ohm
Lead Channel Pacing Threshold Amplitude: 0.5 V
Lead Channel Pacing Threshold Amplitude: 0.5 V
Lead Channel Pacing Threshold Amplitude: 0.75 V
Lead Channel Pacing Threshold Amplitude: 0.75 V
Lead Channel Pacing Threshold Pulse Width: 0.4 ms
Lead Channel Pacing Threshold Pulse Width: 0.4 ms
Lead Channel Pacing Threshold Pulse Width: 0.4 ms
Lead Channel Pacing Threshold Pulse Width: 0.4 ms
Lead Channel Sensing Intrinsic Amplitude: 4 mV
Lead Channel Setting Pacing Amplitude: 1.5 V
Lead Channel Setting Pacing Amplitude: 2.5 V
Lead Channel Setting Pacing Pulse Width: 0.4 ms
Lead Channel Setting Sensing Sensitivity: 5.6 mV

## 2021-08-04 NOTE — Patient Instructions (Signed)
Medication Instructions:  Your physician recommends that you continue on your current medications as directed. Please refer to the Current Medication list given to you today.  *If you need a refill on your cardiac medications before your next appointment, please call your pharmacy*   Lab Work: None If you have labs (blood work) drawn today and your tests are completely normal, you will receive your results only by: [place] (if you have MyChart) OR A paper copy in the mail If you have any lab test that is abnormal or we need to change your treatment, we will call you to review the results.   Follow-Up: At [facility], you and your health needs are our priority.  As part of our continuing mission to provide you with exceptional heart care, we have created designated Provider Care Teams.  These Care Teams include your primary Cardiologist (physician) and Advanced Practice Providers (APPs -  Physician Assistants and Nurse Practitioners) who all work together to provide you with the care you need, when you need it.  We recommend signing up for the patient portal called "MyChart".  Sign up information is provided on this After Visit Summary.  MyChart is used to connect with patients for Virtual Visits (Telemedicine).  Patients are able to view lab/test results, encounter notes, upcoming appointments, etc.  Non-urgent messages can be sent to your provider as well.   To learn more about what you can do with MyChart, go to [email or web address].    Your next appointment:   1 year(s)  The format for your next appointment:   In Person  Provider:   You may see Virl Axe, MD or one of the following Advanced Practice Providers on your designated Care Team:   Tommye Standard, [place] "[facility]" [place], [place]

## 2021-08-13 DIAGNOSIS — Z5321 Procedure and treatment not carried out due to patient leaving prior to being seen by health care provider: Secondary | ICD-10-CM | POA: Diagnosis not present

## 2021-08-13 DIAGNOSIS — I10 Essential (primary) hypertension: Secondary | ICD-10-CM | POA: Diagnosis not present

## 2021-08-23 DIAGNOSIS — I10 Essential (primary) hypertension: Secondary | ICD-10-CM | POA: Diagnosis not present

## 2021-08-23 DIAGNOSIS — E1129 Type 2 diabetes mellitus with other diabetic kidney complication: Secondary | ICD-10-CM | POA: Diagnosis not present

## 2021-08-23 DIAGNOSIS — M1990 Unspecified osteoarthritis, unspecified site: Secondary | ICD-10-CM | POA: Diagnosis not present

## 2021-08-23 DIAGNOSIS — Z6824 Body mass index (BMI) 24.0-24.9, adult: Secondary | ICD-10-CM | POA: Diagnosis not present

## 2021-09-24 DIAGNOSIS — M545 Low back pain, unspecified: Secondary | ICD-10-CM | POA: Diagnosis not present

## 2021-09-24 DIAGNOSIS — S20219A Contusion of unspecified front wall of thorax, initial encounter: Secondary | ICD-10-CM | POA: Diagnosis not present

## 2021-10-03 ENCOUNTER — Ambulatory Visit: Payer: Medicare Other | Admitting: Neurology

## 2021-10-03 ENCOUNTER — Ambulatory Visit (INDEPENDENT_AMBULATORY_CARE_PROVIDER_SITE_OTHER): Payer: Medicare Other

## 2021-10-03 DIAGNOSIS — M545 Low back pain, unspecified: Secondary | ICD-10-CM | POA: Diagnosis not present

## 2021-10-03 DIAGNOSIS — I442 Atrioventricular block, complete: Secondary | ICD-10-CM | POA: Diagnosis not present

## 2021-10-03 DIAGNOSIS — M6281 Muscle weakness (generalized): Secondary | ICD-10-CM | POA: Diagnosis not present

## 2021-10-03 DIAGNOSIS — R2689 Other abnormalities of gait and mobility: Secondary | ICD-10-CM | POA: Diagnosis not present

## 2021-10-03 LAB — CUP PACEART REMOTE DEVICE CHECK
Battery Impedance: 3482 Ohm
Battery Remaining Longevity: 17 mo
Battery Voltage: 2.72 V
Brady Statistic AP VP Percent: 8 %
Brady Statistic AP VS Percent: 0 %
Brady Statistic AS VP Percent: 92 %
Brady Statistic AS VS Percent: 0 %
Date Time Interrogation Session: 20230320065147
Implantable Lead Implant Date: 20101014
Implantable Lead Implant Date: 20101014
Implantable Lead Location: 753859
Implantable Lead Location: 753860
Implantable Lead Model: 5076
Implantable Lead Model: 5076
Implantable Pulse Generator Implant Date: 20101014
Lead Channel Impedance Value: 448 Ohm
Lead Channel Impedance Value: 657 Ohm
Lead Channel Pacing Threshold Amplitude: 0.5 V
Lead Channel Pacing Threshold Amplitude: 0.5 V
Lead Channel Pacing Threshold Pulse Width: 0.4 ms
Lead Channel Pacing Threshold Pulse Width: 0.4 ms
Lead Channel Setting Pacing Amplitude: 1.5 V
Lead Channel Setting Pacing Amplitude: 2.5 V
Lead Channel Setting Pacing Pulse Width: 0.46 ms
Lead Channel Setting Sensing Sensitivity: 5.6 mV

## 2021-10-05 DIAGNOSIS — M545 Low back pain, unspecified: Secondary | ICD-10-CM | POA: Diagnosis not present

## 2021-10-11 NOTE — Progress Notes (Signed)
Remote pacemaker transmission.   

## 2021-10-12 DIAGNOSIS — M545 Low back pain, unspecified: Secondary | ICD-10-CM | POA: Diagnosis not present

## 2021-10-15 DIAGNOSIS — S3991XA Unspecified injury of abdomen, initial encounter: Secondary | ICD-10-CM | POA: Diagnosis not present

## 2021-10-15 DIAGNOSIS — R531 Weakness: Secondary | ICD-10-CM | POA: Diagnosis not present

## 2021-10-15 DIAGNOSIS — E860 Dehydration: Secondary | ICD-10-CM | POA: Diagnosis not present

## 2021-10-15 DIAGNOSIS — E875 Hyperkalemia: Secondary | ICD-10-CM | POA: Diagnosis not present

## 2021-10-15 DIAGNOSIS — N170 Acute kidney failure with tubular necrosis: Secondary | ICD-10-CM | POA: Diagnosis not present

## 2021-10-15 DIAGNOSIS — M12872 Other specific arthropathies, not elsewhere classified, left ankle and foot: Secondary | ICD-10-CM | POA: Diagnosis not present

## 2021-10-15 DIAGNOSIS — Z23 Encounter for immunization: Secondary | ICD-10-CM | POA: Diagnosis not present

## 2021-10-15 DIAGNOSIS — M79672 Pain in left foot: Secondary | ICD-10-CM | POA: Diagnosis not present

## 2021-10-15 DIAGNOSIS — I10 Essential (primary) hypertension: Secondary | ICD-10-CM | POA: Diagnosis not present

## 2021-10-15 DIAGNOSIS — Z79899 Other long term (current) drug therapy: Secondary | ICD-10-CM | POA: Diagnosis not present

## 2021-10-15 DIAGNOSIS — J439 Emphysema, unspecified: Secondary | ICD-10-CM | POA: Diagnosis not present

## 2021-10-15 DIAGNOSIS — E119 Type 2 diabetes mellitus without complications: Secondary | ICD-10-CM | POA: Diagnosis not present

## 2021-10-15 DIAGNOSIS — J8410 Pulmonary fibrosis, unspecified: Secondary | ICD-10-CM | POA: Diagnosis not present

## 2021-10-15 DIAGNOSIS — Z7984 Long term (current) use of oral hypoglycemic drugs: Secondary | ICD-10-CM | POA: Diagnosis not present

## 2021-10-15 DIAGNOSIS — D649 Anemia, unspecified: Secondary | ICD-10-CM | POA: Diagnosis not present

## 2021-10-15 DIAGNOSIS — J479 Bronchiectasis, uncomplicated: Secondary | ICD-10-CM | POA: Diagnosis not present

## 2021-10-15 DIAGNOSIS — E861 Hypovolemia: Secondary | ICD-10-CM | POA: Diagnosis not present

## 2021-10-15 DIAGNOSIS — I272 Pulmonary hypertension, unspecified: Secondary | ICD-10-CM | POA: Diagnosis not present

## 2021-10-15 DIAGNOSIS — Z888 Allergy status to other drugs, medicaments and biological substances status: Secondary | ICD-10-CM | POA: Diagnosis not present

## 2021-10-15 DIAGNOSIS — Z950 Presence of cardiac pacemaker: Secondary | ICD-10-CM | POA: Diagnosis not present

## 2021-10-15 DIAGNOSIS — N179 Acute kidney failure, unspecified: Secondary | ICD-10-CM | POA: Diagnosis not present

## 2021-10-15 DIAGNOSIS — D509 Iron deficiency anemia, unspecified: Secondary | ICD-10-CM | POA: Diagnosis not present

## 2021-10-15 DIAGNOSIS — Z7982 Long term (current) use of aspirin: Secondary | ICD-10-CM | POA: Diagnosis not present

## 2021-10-15 DIAGNOSIS — Z7952 Long term (current) use of systemic steroids: Secondary | ICD-10-CM | POA: Diagnosis not present

## 2021-10-15 DIAGNOSIS — E872 Acidosis, unspecified: Secondary | ICD-10-CM | POA: Diagnosis not present

## 2021-10-15 DIAGNOSIS — R112 Nausea with vomiting, unspecified: Secondary | ICD-10-CM | POA: Diagnosis not present

## 2021-10-15 DIAGNOSIS — I959 Hypotension, unspecified: Secondary | ICD-10-CM | POA: Diagnosis not present

## 2021-10-15 DIAGNOSIS — M8588 Other specified disorders of bone density and structure, other site: Secondary | ICD-10-CM | POA: Diagnosis not present

## 2021-10-15 DIAGNOSIS — Z66 Do not resuscitate: Secondary | ICD-10-CM | POA: Diagnosis not present

## 2021-10-15 DIAGNOSIS — S299XXA Unspecified injury of thorax, initial encounter: Secondary | ICD-10-CM | POA: Diagnosis not present

## 2021-10-26 DIAGNOSIS — I459 Conduction disorder, unspecified: Secondary | ICD-10-CM | POA: Diagnosis not present

## 2021-10-26 DIAGNOSIS — E872 Acidosis, unspecified: Secondary | ICD-10-CM | POA: Diagnosis not present

## 2021-10-26 DIAGNOSIS — Z950 Presence of cardiac pacemaker: Secondary | ICD-10-CM | POA: Diagnosis not present

## 2021-10-26 DIAGNOSIS — Z7982 Long term (current) use of aspirin: Secondary | ICD-10-CM | POA: Diagnosis not present

## 2021-10-26 DIAGNOSIS — N179 Acute kidney failure, unspecified: Secondary | ICD-10-CM | POA: Diagnosis not present

## 2021-10-26 DIAGNOSIS — J439 Emphysema, unspecified: Secondary | ICD-10-CM | POA: Diagnosis not present

## 2021-10-26 DIAGNOSIS — E860 Dehydration: Secondary | ICD-10-CM | POA: Diagnosis not present

## 2021-10-26 DIAGNOSIS — Z7984 Long term (current) use of oral hypoglycemic drugs: Secondary | ICD-10-CM | POA: Diagnosis not present

## 2021-10-26 DIAGNOSIS — M8588 Other specified disorders of bone density and structure, other site: Secondary | ICD-10-CM | POA: Diagnosis not present

## 2021-10-26 DIAGNOSIS — I10 Essential (primary) hypertension: Secondary | ICD-10-CM | POA: Diagnosis not present

## 2021-10-26 DIAGNOSIS — E1151 Type 2 diabetes mellitus with diabetic peripheral angiopathy without gangrene: Secondary | ICD-10-CM | POA: Diagnosis not present

## 2021-10-26 DIAGNOSIS — I700 Atherosclerosis of aorta: Secondary | ICD-10-CM | POA: Diagnosis not present

## 2021-10-26 DIAGNOSIS — M109 Gout, unspecified: Secondary | ICD-10-CM | POA: Diagnosis not present

## 2021-10-26 DIAGNOSIS — J479 Bronchiectasis, uncomplicated: Secondary | ICD-10-CM | POA: Diagnosis not present

## 2021-10-26 DIAGNOSIS — Z79891 Long term (current) use of opiate analgesic: Secondary | ICD-10-CM | POA: Diagnosis not present

## 2021-10-26 DIAGNOSIS — D649 Anemia, unspecified: Secondary | ICD-10-CM | POA: Diagnosis not present

## 2021-10-28 DIAGNOSIS — E872 Acidosis, unspecified: Secondary | ICD-10-CM | POA: Diagnosis not present

## 2021-10-28 DIAGNOSIS — Z7982 Long term (current) use of aspirin: Secondary | ICD-10-CM | POA: Diagnosis not present

## 2021-10-28 DIAGNOSIS — Z7984 Long term (current) use of oral hypoglycemic drugs: Secondary | ICD-10-CM | POA: Diagnosis not present

## 2021-10-28 DIAGNOSIS — Z79891 Long term (current) use of opiate analgesic: Secondary | ICD-10-CM | POA: Diagnosis not present

## 2021-10-28 DIAGNOSIS — I459 Conduction disorder, unspecified: Secondary | ICD-10-CM | POA: Diagnosis not present

## 2021-10-28 DIAGNOSIS — I700 Atherosclerosis of aorta: Secondary | ICD-10-CM | POA: Diagnosis not present

## 2021-10-28 DIAGNOSIS — E860 Dehydration: Secondary | ICD-10-CM | POA: Diagnosis not present

## 2021-10-28 DIAGNOSIS — I10 Essential (primary) hypertension: Secondary | ICD-10-CM | POA: Diagnosis not present

## 2021-10-28 DIAGNOSIS — E1151 Type 2 diabetes mellitus with diabetic peripheral angiopathy without gangrene: Secondary | ICD-10-CM | POA: Diagnosis not present

## 2021-10-28 DIAGNOSIS — J479 Bronchiectasis, uncomplicated: Secondary | ICD-10-CM | POA: Diagnosis not present

## 2021-10-28 DIAGNOSIS — Z950 Presence of cardiac pacemaker: Secondary | ICD-10-CM | POA: Diagnosis not present

## 2021-10-28 DIAGNOSIS — N179 Acute kidney failure, unspecified: Secondary | ICD-10-CM | POA: Diagnosis not present

## 2021-10-28 DIAGNOSIS — M109 Gout, unspecified: Secondary | ICD-10-CM | POA: Diagnosis not present

## 2021-10-28 DIAGNOSIS — M8588 Other specified disorders of bone density and structure, other site: Secondary | ICD-10-CM | POA: Diagnosis not present

## 2021-10-28 DIAGNOSIS — D649 Anemia, unspecified: Secondary | ICD-10-CM | POA: Diagnosis not present

## 2021-10-28 DIAGNOSIS — J439 Emphysema, unspecified: Secondary | ICD-10-CM | POA: Diagnosis not present

## 2021-10-31 DIAGNOSIS — I459 Conduction disorder, unspecified: Secondary | ICD-10-CM | POA: Diagnosis not present

## 2021-10-31 DIAGNOSIS — E860 Dehydration: Secondary | ICD-10-CM | POA: Diagnosis not present

## 2021-10-31 DIAGNOSIS — Z7982 Long term (current) use of aspirin: Secondary | ICD-10-CM | POA: Diagnosis not present

## 2021-10-31 DIAGNOSIS — J439 Emphysema, unspecified: Secondary | ICD-10-CM | POA: Diagnosis not present

## 2021-10-31 DIAGNOSIS — M1990 Unspecified osteoarthritis, unspecified site: Secondary | ICD-10-CM | POA: Diagnosis not present

## 2021-10-31 DIAGNOSIS — Z7984 Long term (current) use of oral hypoglycemic drugs: Secondary | ICD-10-CM | POA: Diagnosis not present

## 2021-10-31 DIAGNOSIS — J479 Bronchiectasis, uncomplicated: Secondary | ICD-10-CM | POA: Diagnosis not present

## 2021-10-31 DIAGNOSIS — Z6822 Body mass index (BMI) 22.0-22.9, adult: Secondary | ICD-10-CM | POA: Diagnosis not present

## 2021-10-31 DIAGNOSIS — M8588 Other specified disorders of bone density and structure, other site: Secondary | ICD-10-CM | POA: Diagnosis not present

## 2021-10-31 DIAGNOSIS — E7439 Other disorders of intestinal carbohydrate absorption: Secondary | ICD-10-CM | POA: Diagnosis not present

## 2021-10-31 DIAGNOSIS — I700 Atherosclerosis of aorta: Secondary | ICD-10-CM | POA: Diagnosis not present

## 2021-10-31 DIAGNOSIS — D649 Anemia, unspecified: Secondary | ICD-10-CM | POA: Diagnosis not present

## 2021-10-31 DIAGNOSIS — E872 Acidosis, unspecified: Secondary | ICD-10-CM | POA: Diagnosis not present

## 2021-10-31 DIAGNOSIS — I10 Essential (primary) hypertension: Secondary | ICD-10-CM | POA: Diagnosis not present

## 2021-10-31 DIAGNOSIS — M109 Gout, unspecified: Secondary | ICD-10-CM | POA: Diagnosis not present

## 2021-10-31 DIAGNOSIS — Z950 Presence of cardiac pacemaker: Secondary | ICD-10-CM | POA: Diagnosis not present

## 2021-10-31 DIAGNOSIS — Z79891 Long term (current) use of opiate analgesic: Secondary | ICD-10-CM | POA: Diagnosis not present

## 2021-10-31 DIAGNOSIS — E1165 Type 2 diabetes mellitus with hyperglycemia: Secondary | ICD-10-CM | POA: Diagnosis not present

## 2021-10-31 DIAGNOSIS — E1151 Type 2 diabetes mellitus with diabetic peripheral angiopathy without gangrene: Secondary | ICD-10-CM | POA: Diagnosis not present

## 2021-10-31 DIAGNOSIS — N179 Acute kidney failure, unspecified: Secondary | ICD-10-CM | POA: Diagnosis not present

## 2021-11-03 DIAGNOSIS — D649 Anemia, unspecified: Secondary | ICD-10-CM | POA: Diagnosis not present

## 2021-11-03 DIAGNOSIS — I700 Atherosclerosis of aorta: Secondary | ICD-10-CM | POA: Diagnosis not present

## 2021-11-03 DIAGNOSIS — E1151 Type 2 diabetes mellitus with diabetic peripheral angiopathy without gangrene: Secondary | ICD-10-CM | POA: Diagnosis not present

## 2021-11-03 DIAGNOSIS — I459 Conduction disorder, unspecified: Secondary | ICD-10-CM | POA: Diagnosis not present

## 2021-11-03 DIAGNOSIS — M8588 Other specified disorders of bone density and structure, other site: Secondary | ICD-10-CM | POA: Diagnosis not present

## 2021-11-03 DIAGNOSIS — J439 Emphysema, unspecified: Secondary | ICD-10-CM | POA: Diagnosis not present

## 2021-11-03 DIAGNOSIS — Z7984 Long term (current) use of oral hypoglycemic drugs: Secondary | ICD-10-CM | POA: Diagnosis not present

## 2021-11-03 DIAGNOSIS — Z7982 Long term (current) use of aspirin: Secondary | ICD-10-CM | POA: Diagnosis not present

## 2021-11-03 DIAGNOSIS — M109 Gout, unspecified: Secondary | ICD-10-CM | POA: Diagnosis not present

## 2021-11-03 DIAGNOSIS — J479 Bronchiectasis, uncomplicated: Secondary | ICD-10-CM | POA: Diagnosis not present

## 2021-11-03 DIAGNOSIS — E860 Dehydration: Secondary | ICD-10-CM | POA: Diagnosis not present

## 2021-11-03 DIAGNOSIS — I10 Essential (primary) hypertension: Secondary | ICD-10-CM | POA: Diagnosis not present

## 2021-11-03 DIAGNOSIS — Z79891 Long term (current) use of opiate analgesic: Secondary | ICD-10-CM | POA: Diagnosis not present

## 2021-11-03 DIAGNOSIS — Z950 Presence of cardiac pacemaker: Secondary | ICD-10-CM | POA: Diagnosis not present

## 2021-11-03 DIAGNOSIS — N179 Acute kidney failure, unspecified: Secondary | ICD-10-CM | POA: Diagnosis not present

## 2021-11-03 DIAGNOSIS — E872 Acidosis, unspecified: Secondary | ICD-10-CM | POA: Diagnosis not present

## 2021-11-04 DIAGNOSIS — N179 Acute kidney failure, unspecified: Secondary | ICD-10-CM | POA: Diagnosis not present

## 2021-11-08 DIAGNOSIS — J439 Emphysema, unspecified: Secondary | ICD-10-CM | POA: Diagnosis not present

## 2021-11-08 DIAGNOSIS — Z950 Presence of cardiac pacemaker: Secondary | ICD-10-CM | POA: Diagnosis not present

## 2021-11-08 DIAGNOSIS — J479 Bronchiectasis, uncomplicated: Secondary | ICD-10-CM | POA: Diagnosis not present

## 2021-11-08 DIAGNOSIS — I10 Essential (primary) hypertension: Secondary | ICD-10-CM | POA: Diagnosis not present

## 2021-11-08 DIAGNOSIS — E1151 Type 2 diabetes mellitus with diabetic peripheral angiopathy without gangrene: Secondary | ICD-10-CM | POA: Diagnosis not present

## 2021-11-08 DIAGNOSIS — Z7984 Long term (current) use of oral hypoglycemic drugs: Secondary | ICD-10-CM | POA: Diagnosis not present

## 2021-11-08 DIAGNOSIS — N179 Acute kidney failure, unspecified: Secondary | ICD-10-CM | POA: Diagnosis not present

## 2021-11-08 DIAGNOSIS — Z7982 Long term (current) use of aspirin: Secondary | ICD-10-CM | POA: Diagnosis not present

## 2021-11-08 DIAGNOSIS — I459 Conduction disorder, unspecified: Secondary | ICD-10-CM | POA: Diagnosis not present

## 2021-11-08 DIAGNOSIS — M8588 Other specified disorders of bone density and structure, other site: Secondary | ICD-10-CM | POA: Diagnosis not present

## 2021-11-08 DIAGNOSIS — E872 Acidosis, unspecified: Secondary | ICD-10-CM | POA: Diagnosis not present

## 2021-11-08 DIAGNOSIS — Z79891 Long term (current) use of opiate analgesic: Secondary | ICD-10-CM | POA: Diagnosis not present

## 2021-11-08 DIAGNOSIS — M109 Gout, unspecified: Secondary | ICD-10-CM | POA: Diagnosis not present

## 2021-11-08 DIAGNOSIS — I700 Atherosclerosis of aorta: Secondary | ICD-10-CM | POA: Diagnosis not present

## 2021-11-08 DIAGNOSIS — E860 Dehydration: Secondary | ICD-10-CM | POA: Diagnosis not present

## 2021-11-08 DIAGNOSIS — D649 Anemia, unspecified: Secondary | ICD-10-CM | POA: Diagnosis not present

## 2021-11-09 ENCOUNTER — Other Ambulatory Visit: Payer: Self-pay | Admitting: Hematology and Oncology

## 2021-11-09 ENCOUNTER — Other Ambulatory Visit: Payer: Self-pay

## 2021-11-09 DIAGNOSIS — D649 Anemia, unspecified: Secondary | ICD-10-CM

## 2021-11-10 ENCOUNTER — Encounter: Payer: Self-pay | Admitting: Hematology and Oncology

## 2021-11-10 ENCOUNTER — Inpatient Hospital Stay: Payer: Medicare Other

## 2021-11-10 ENCOUNTER — Inpatient Hospital Stay: Payer: Medicare Other | Attending: Hematology and Oncology | Admitting: Hematology and Oncology

## 2021-11-10 VITALS — BP 158/77 | HR 75 | Temp 98.5°F | Resp 18 | Ht 61.5 in | Wt 144.2 lb

## 2021-11-10 DIAGNOSIS — Z809 Family history of malignant neoplasm, unspecified: Secondary | ICD-10-CM | POA: Diagnosis not present

## 2021-11-10 DIAGNOSIS — I700 Atherosclerosis of aorta: Secondary | ICD-10-CM | POA: Insufficient documentation

## 2021-11-10 DIAGNOSIS — D649 Anemia, unspecified: Secondary | ICD-10-CM | POA: Diagnosis not present

## 2021-11-10 DIAGNOSIS — I10 Essential (primary) hypertension: Secondary | ICD-10-CM | POA: Insufficient documentation

## 2021-11-10 DIAGNOSIS — R600 Localized edema: Secondary | ICD-10-CM | POA: Insufficient documentation

## 2021-11-10 DIAGNOSIS — R5383 Other fatigue: Secondary | ICD-10-CM | POA: Insufficient documentation

## 2021-11-10 DIAGNOSIS — Z79899 Other long term (current) drug therapy: Secondary | ICD-10-CM | POA: Insufficient documentation

## 2021-11-10 DIAGNOSIS — E119 Type 2 diabetes mellitus without complications: Secondary | ICD-10-CM | POA: Diagnosis not present

## 2021-11-10 DIAGNOSIS — Z823 Family history of stroke: Secondary | ICD-10-CM | POA: Insufficient documentation

## 2021-11-10 LAB — BASIC METABOLIC PANEL
BUN: 26 — AB (ref 4–21)
CO2: 28 — AB (ref 13–22)
Chloride: 104 (ref 99–108)
Creatinine: 1.2 (ref 0.6–1.3)
Glucose: 105
Potassium: 4.3 mEq/L (ref 3.5–5.1)
Sodium: 142 (ref 137–147)

## 2021-11-10 LAB — COMPREHENSIVE METABOLIC PANEL
Albumin: 3.7 (ref 3.5–5.0)
Calcium: 8.2 — AB (ref 8.7–10.7)

## 2021-11-10 LAB — CBC
MCV: 91 (ref 80–94)
RBC: 3.57 — AB (ref 3.87–5.11)

## 2021-11-10 LAB — HEPATIC FUNCTION PANEL
ALT: 16 U/L (ref 10–40)
AST: 22 (ref 14–40)
Alkaline Phosphatase: 90 (ref 25–125)
Bilirubin, Total: 0.4

## 2021-11-10 LAB — CBC AND DIFFERENTIAL
HCT: 32 — AB (ref 41–53)
Hemoglobin: 10.3 — AB (ref 13.5–17.5)
Neutrophils Absolute: 3.48
Platelets: 165 10*3/uL (ref 150–400)
WBC: 7.1

## 2021-11-10 LAB — FERRITIN: Ferritin: 207 ng/mL (ref 24–336)

## 2021-11-10 LAB — LACTATE DEHYDROGENASE: LDH: 187 U/L (ref 98–192)

## 2021-11-10 NOTE — Progress Notes (Cosign Needed)
?[facility]  ?[address] ?[place],  [place]  [address] ?[phone] ? ?Clinic Day:  11/10/2021 ? ?Referring physician: Angelina Sheriff, MD ? ? ?REASON FOR CONSULTATION:  ?Anemia ? ?HISTORY OF PRESENT ILLNESS:  ?Scott [name] is a 88 y.o. male with a history of mild anemia dating back at least to 2018 who is referred in consultation by Dr. Lovette Cliche for assessment and management.  The patient originally saw Dr. Bobby Rumpf in September 2018.  Evaluation of his anemia did not reveal a specific etiology and because it was mild his care was turned back over to Dr. Rex Kras.  He is now referred back with worsening anemia.  He was admitted on April 1st through [facility] emergency room with acute kidney injury with a BUN of 144 and creatinine of 6.1.  This was felt to be due to nausea, vomiting, diarrhea and decreased oral intake.  His hemoglobin was 12.1 in the emergency room but after fluid resuscitation his hemoglobin dropped to 9 with an MCV of 89.  He also had mild leukocytosis during his hospitalization, but was also treated with steroids for possible gout.  Iron studies revealed a serum iron of less than 10 ug/dL, TIBC of 168 mcg/dL and ferritin 424 ng/mL.  B12 and folate were normal at 577 and 8.2 respectively.  His kidney function improved.  He had a BUN of 32 and creatinine 1.4, as well as a hemoglobin was 9.5 on discharge.  Furosemide was discontinued.  CT chest, abdomen and pelvis did not reveal any obvious source of blood loss.  Changes suggestive of enteritis were seen.  He saw Dr. Lin Landsman for hospital follow-up on April 17th and his hemoglobin remains 9.5.  Prior to his hospitalization, his hemoglobin had been running between 11 and 12.  Labs at Dr. Janace Aris office in January revealed hemoglobin of 11.4 with an MCV 97, BUN 20 and creatinine 1.3.  White blood cell count and platelets were normal. ? ?The patient states he had been on oral iron supplementation  for several years, but has been off it for about 6 months.  He was instructed to resume this at the hospital, so has been back on this for 2 to 3 weeks.  He states he tolerates this without difficulty.  He also takes a B12 supplement daily.  His last colonoscopy was in January 2013 at age 17 at which time he had removal of a hyperplastic polyp.  He has hypertension and diabetes.  He states he has had a pacemaker for about 20 years for heart block.  He has not seen his cardiologist recently.  He reports fatigue and states he has been relatively inactive since his hospitalization.  He has noted lower extremity edema since being off the furosemide.  He denies any overt form of blood loss.  He denies continued gastrointestinal symptoms. ? ?REVIEW OF SYSTEMS:  ?Review of Systems  ?Constitutional:  Positive for fatigue. Negative for appetite change, chills, fever and unexpected weight change.  ?HENT:   Negative for lump/mass, mouth sores and sore throat.   ?Respiratory:  Negative for cough and shortness of breath.   ?Cardiovascular:  Negative for chest pain and leg swelling.  ?Gastrointestinal:  Negative for abdominal pain, constipation, diarrhea, nausea and vomiting.  ?Genitourinary:  Negative for difficulty urinating, dysuria, frequency and hematuria.   ?Musculoskeletal:  Negative for arthralgias, back pain and myalgias.  ?Skin:  Negative for itching, rash and wound.  ?Neurological:  Negative for  dizziness, extremity weakness, headaches, light-headedness and numbness.  ?Hematological:  Negative for adenopathy. Does not bruise/bleed easily.  ?Psychiatric/Behavioral:  Negative for depression and sleep disturbance. The patient is not nervous/anxious.    ? ?VITALS:  ?Blood pressure (!) 158/77, pulse 75, temperature 98.5 ?F (36.9 ?C), temperature source Oral, resp. rate 18, height 5' 1.5" (1.562 m), weight 144 lb 3.2 oz (65.4 kg), SpO2 93 %.  ?Wt Readings from Last 3 Encounters:  ?11/10/21 144 lb 3.2 oz (65.4 kg)  ?08/04/21  149 lb (67.6 kg)  ?02/01/21 154 lb (69.9 kg)  ?  ?Body mass index is 26.81 kg/m?. ? ?Performance status (ECOG): 2 - Symptomatic, <50% confined to bed ? ?PHYSICAL EXAM:  ?Physical Exam ?Vitals and nursing note reviewed.  ?Constitutional:   ?   General: He is not in acute distress. ?   Appearance: Normal appearance. He is normal weight.  ?HENT:  ?   Head: Normocephalic and atraumatic.  ?   Mouth/Throat:  ?   Mouth: Mucous membranes are moist.  ?   Pharynx: Oropharynx is clear. No oropharyngeal exudate or posterior oropharyngeal erythema.  ?Eyes:  ?   General: No scleral icterus. ?   Extraocular Movements: Extraocular movements intact.  ?   Conjunctiva/sclera: Conjunctivae normal.  ?   Pupils: Pupils are equal, round, and reactive to light.  ?Cardiovascular:  ?   Rate and Rhythm: Normal rate and regular rhythm.  ?   Heart sounds: Normal heart sounds. No murmur heard. ?  No friction rub. No gallop.  ?Pulmonary:  ?   Effort: Pulmonary effort is normal.  ?   Breath sounds: Normal breath sounds. No wheezing, rhonchi or rales.  ?Abdominal:  ?   General: Bowel sounds are normal. There is no distension.  ?   Palpations: Abdomen is soft. There is no hepatomegaly, splenomegaly or mass.  ?   Tenderness: There is no abdominal tenderness.  ?Musculoskeletal:     ?   General: Normal range of motion.  ?   Cervical back: Normal range of motion and neck supple. No tenderness.  ?   Right lower leg: Edema (1+) present.  ?   Left lower leg: Edema (1+) present.  ?Lymphadenopathy:  ?   Cervical: No cervical adenopathy.  ?   Upper Body:  ?   Right upper body: No supraclavicular or axillary adenopathy.  ?   Left upper body: No supraclavicular or axillary adenopathy.  ?   Lower Body: No right inguinal adenopathy. No left inguinal adenopathy.  ?Skin: ?   General: Skin is warm and dry.  ?   Coloration: Skin is not jaundiced.  ?   Findings: No bruising or rash.  ?Neurological:  ?   Mental Status: He is alert and oriented to person, place, and  time.  ?   Cranial Nerves: No cranial nerve deficit.  ?Psychiatric:     ?   Mood and Affect: Mood normal.     ?   Behavior: Behavior normal.     ?   Thought Content: Thought content normal.  ? ? ? ?LABS:  ? ? ?  Latest Ref Rng & Units 11/10/2021  ? 12:00 AM 04/30/2009  ?  4:00 AM 04/29/2009  ?  6:58 PM  ?CBC  ?WBC  7.1      7.8     ?Hemoglobin 13.5 - 17.5 10.3      12.2   12.2    ?Hematocrit 41 - 53 32      35.3  36.0    ?Platelets 150 - 400 K/uL 165      158     ?  ? This result is from an external source.  ? ? ?  Latest Ref Rng & Units 11/10/2021  ? 12:00 AM 04/30/2009  ?  4:00 AM 04/29/2009  ?  6:58 PM  ?CMP  ?Glucose 70 - 99 mg/dL  112   130    ?BUN 4 - '21 26      13   15    '$ ?Creatinine 0.6 - 1.3 1.2      1.18   1.2    ?Sodium 137 - 147 142      139   142    ?Potassium 3.5 - 5.1 mEq/L 4.3      4.0   4.0    ?Chloride 99 - 108 104      106   104    ?CO2 13 - '22 28      27     '$ ?Calcium 8.7 - 10.7 8.2      8.2     ?Alkaline Phos 25 - 125 90         ?AST 14 - 40 22         ?ALT 10 - 40 U/L 16         ?  ? This result is from an external source.  ? ? ? ?No results found for: CEA1 / No results found for: CEA1 ?No results found for: PSA1 ?No results found for: SHF026 ?No results found for: VZC588  ?No results found for: TOTALPROTELP, ALBUMINELP, A1GS, A2GS, BETS, BETA2SER, GAMS, MSPIKE, SPEI ?No results found for: TIBC, FERRITIN, IRONPCTSAT ?No results found for: LDH ? ?STUDIES:  ?No results found.  ? ? Exam(s): [id number] CT/CT CHEST-ABD-PELV W/O IV CM  ?CLINICAL DATA: Nausea, vomiting, diarrhea, decreased appetite and  ?recent fall.  ?EXAM:  ?CT CHEST, ABDOMEN AND PELVIS WITHOUT CONTRAST  ?TECHNIQUE:  ?Multidetector CT imaging of the chest, abdomen and pelvis was  ?performed following the standard protocol without IV contrast.  ?RADIATION DOSE REDUCTION: This exam was performed according to the  ?departmental dose-optimization program which includes automated  ?exposure control, adjustment of the mA and/or kV according  to  ?patient size and/or use of iterative reconstruction technique.  ?COMPARISON: None.  ? ?FINDINGS:  ?CT CHEST FINDINGS  ?Cardiovascular: Normal caliber thoracic aorta demonstrating mild  ?atheroscleros

## 2021-11-11 ENCOUNTER — Encounter: Payer: Self-pay | Admitting: Hematology and Oncology

## 2021-11-11 DIAGNOSIS — Z823 Family history of stroke: Secondary | ICD-10-CM | POA: Diagnosis not present

## 2021-11-11 DIAGNOSIS — Z950 Presence of cardiac pacemaker: Secondary | ICD-10-CM | POA: Diagnosis not present

## 2021-11-11 DIAGNOSIS — R5383 Other fatigue: Secondary | ICD-10-CM | POA: Diagnosis not present

## 2021-11-11 DIAGNOSIS — J439 Emphysema, unspecified: Secondary | ICD-10-CM | POA: Diagnosis not present

## 2021-11-11 DIAGNOSIS — M8588 Other specified disorders of bone density and structure, other site: Secondary | ICD-10-CM | POA: Diagnosis not present

## 2021-11-11 DIAGNOSIS — R600 Localized edema: Secondary | ICD-10-CM | POA: Diagnosis not present

## 2021-11-11 DIAGNOSIS — E860 Dehydration: Secondary | ICD-10-CM | POA: Diagnosis not present

## 2021-11-11 DIAGNOSIS — Z7982 Long term (current) use of aspirin: Secondary | ICD-10-CM | POA: Diagnosis not present

## 2021-11-11 DIAGNOSIS — J479 Bronchiectasis, uncomplicated: Secondary | ICD-10-CM | POA: Diagnosis not present

## 2021-11-11 DIAGNOSIS — E1151 Type 2 diabetes mellitus with diabetic peripheral angiopathy without gangrene: Secondary | ICD-10-CM | POA: Diagnosis not present

## 2021-11-11 DIAGNOSIS — E872 Acidosis, unspecified: Secondary | ICD-10-CM | POA: Diagnosis not present

## 2021-11-11 DIAGNOSIS — Z7984 Long term (current) use of oral hypoglycemic drugs: Secondary | ICD-10-CM | POA: Diagnosis not present

## 2021-11-11 DIAGNOSIS — M109 Gout, unspecified: Secondary | ICD-10-CM | POA: Diagnosis not present

## 2021-11-11 DIAGNOSIS — D649 Anemia, unspecified: Secondary | ICD-10-CM | POA: Diagnosis not present

## 2021-11-11 DIAGNOSIS — Z79891 Long term (current) use of opiate analgesic: Secondary | ICD-10-CM | POA: Diagnosis not present

## 2021-11-11 DIAGNOSIS — N179 Acute kidney failure, unspecified: Secondary | ICD-10-CM | POA: Diagnosis not present

## 2021-11-11 DIAGNOSIS — Z79899 Other long term (current) drug therapy: Secondary | ICD-10-CM | POA: Diagnosis not present

## 2021-11-11 DIAGNOSIS — I10 Essential (primary) hypertension: Secondary | ICD-10-CM | POA: Diagnosis not present

## 2021-11-11 DIAGNOSIS — E119 Type 2 diabetes mellitus without complications: Secondary | ICD-10-CM | POA: Diagnosis not present

## 2021-11-11 DIAGNOSIS — I700 Atherosclerosis of aorta: Secondary | ICD-10-CM | POA: Diagnosis not present

## 2021-11-11 DIAGNOSIS — Z809 Family history of malignant neoplasm, unspecified: Secondary | ICD-10-CM | POA: Diagnosis not present

## 2021-11-11 DIAGNOSIS — I459 Conduction disorder, unspecified: Secondary | ICD-10-CM | POA: Diagnosis not present

## 2021-11-11 LAB — IRON AND TIBC
Iron: 91 ug/dL (ref 45–182)
Saturation Ratios: 32 % (ref 17.9–39.5)
TIBC: 282 ug/dL (ref 250–450)
UIBC: 191 ug/dL

## 2021-11-13 LAB — SOLUBLE TRANSFERRIN RECEPTOR: Transferrin Receptor: 13.5 nmol/L (ref 12.2–27.3)

## 2021-11-14 LAB — MULTIPLE MYELOMA PANEL, SERUM
Albumin SerPl Elph-Mcnc: 3.4 g/dL (ref 2.9–4.4)
Albumin/Glob SerPl: 1.3 (ref 0.7–1.7)
Alpha 1: 0.2 g/dL (ref 0.0–0.4)
Alpha2 Glob SerPl Elph-Mcnc: 0.8 g/dL (ref 0.4–1.0)
B-Globulin SerPl Elph-Mcnc: 0.8 g/dL (ref 0.7–1.3)
Gamma Glob SerPl Elph-Mcnc: 0.9 g/dL (ref 0.4–1.8)
Globulin, Total: 2.7 g/dL (ref 2.2–3.9)
IgA: 227 mg/dL (ref 61–437)
IgG (Immunoglobin G), Serum: 944 mg/dL (ref 603–1613)
IgM (Immunoglobulin M), Srm: 106 mg/dL (ref 15–143)
Total Protein ELP: 6.1 g/dL (ref 6.0–8.5)

## 2021-11-18 DIAGNOSIS — J439 Emphysema, unspecified: Secondary | ICD-10-CM | POA: Diagnosis not present

## 2021-11-18 DIAGNOSIS — Z7984 Long term (current) use of oral hypoglycemic drugs: Secondary | ICD-10-CM | POA: Diagnosis not present

## 2021-11-18 DIAGNOSIS — J479 Bronchiectasis, uncomplicated: Secondary | ICD-10-CM | POA: Diagnosis not present

## 2021-11-18 DIAGNOSIS — E872 Acidosis, unspecified: Secondary | ICD-10-CM | POA: Diagnosis not present

## 2021-11-18 DIAGNOSIS — M2012 Hallux valgus (acquired), left foot: Secondary | ICD-10-CM | POA: Diagnosis not present

## 2021-11-18 DIAGNOSIS — N179 Acute kidney failure, unspecified: Secondary | ICD-10-CM | POA: Diagnosis not present

## 2021-11-18 DIAGNOSIS — Z79891 Long term (current) use of opiate analgesic: Secondary | ICD-10-CM | POA: Diagnosis not present

## 2021-11-18 DIAGNOSIS — Z950 Presence of cardiac pacemaker: Secondary | ICD-10-CM | POA: Diagnosis not present

## 2021-11-18 DIAGNOSIS — E1151 Type 2 diabetes mellitus with diabetic peripheral angiopathy without gangrene: Secondary | ICD-10-CM | POA: Diagnosis not present

## 2021-11-18 DIAGNOSIS — E119 Type 2 diabetes mellitus without complications: Secondary | ICD-10-CM | POA: Diagnosis not present

## 2021-11-18 DIAGNOSIS — M2011 Hallux valgus (acquired), right foot: Secondary | ICD-10-CM | POA: Diagnosis not present

## 2021-11-18 DIAGNOSIS — L603 Nail dystrophy: Secondary | ICD-10-CM | POA: Diagnosis not present

## 2021-11-18 DIAGNOSIS — M8588 Other specified disorders of bone density and structure, other site: Secondary | ICD-10-CM | POA: Diagnosis not present

## 2021-11-18 DIAGNOSIS — I459 Conduction disorder, unspecified: Secondary | ICD-10-CM | POA: Diagnosis not present

## 2021-11-18 DIAGNOSIS — I10 Essential (primary) hypertension: Secondary | ICD-10-CM | POA: Diagnosis not present

## 2021-11-18 DIAGNOSIS — E860 Dehydration: Secondary | ICD-10-CM | POA: Diagnosis not present

## 2021-11-18 DIAGNOSIS — D649 Anemia, unspecified: Secondary | ICD-10-CM | POA: Diagnosis not present

## 2021-11-18 DIAGNOSIS — I700 Atherosclerosis of aorta: Secondary | ICD-10-CM | POA: Diagnosis not present

## 2021-11-18 DIAGNOSIS — Z7982 Long term (current) use of aspirin: Secondary | ICD-10-CM | POA: Diagnosis not present

## 2021-11-18 DIAGNOSIS — M109 Gout, unspecified: Secondary | ICD-10-CM | POA: Diagnosis not present

## 2021-11-21 ENCOUNTER — Encounter: Payer: Self-pay | Admitting: Hematology and Oncology

## 2021-11-21 ENCOUNTER — Inpatient Hospital Stay (INDEPENDENT_AMBULATORY_CARE_PROVIDER_SITE_OTHER): Payer: Medicare Other

## 2021-11-21 ENCOUNTER — Inpatient Hospital Stay: Payer: Medicare Other | Attending: Hematology and Oncology | Admitting: Hematology and Oncology

## 2021-11-21 VITALS — BP 140/65 | HR 74 | Temp 98.2°F | Resp 18 | Ht 61.6 in | Wt 144.8 lb

## 2021-11-21 DIAGNOSIS — D649 Anemia, unspecified: Secondary | ICD-10-CM

## 2021-11-21 DIAGNOSIS — D638 Anemia in other chronic diseases classified elsewhere: Secondary | ICD-10-CM | POA: Diagnosis not present

## 2021-11-21 LAB — BASIC METABOLIC PANEL
BUN: 28 — AB (ref 4–21)
CO2: 28 — AB (ref 13–22)
Chloride: 104 (ref 99–108)
Creatinine: 1.3 (ref 0.6–1.3)
Glucose: 130
Potassium: 4.4 mEq/L (ref 3.5–5.1)
Sodium: 140 (ref 137–147)

## 2021-11-21 LAB — COMPREHENSIVE METABOLIC PANEL: Calcium: 8.3 — AB (ref 8.7–10.7)

## 2021-11-21 LAB — CBC AND DIFFERENTIAL
HCT: 32 — AB (ref 41–53)
Hemoglobin: 10.3 — AB (ref 13.5–17.5)
Neutrophils Absolute: 6.59
Platelets: 203 10*3/uL (ref 150–400)
WBC: 10.8

## 2021-11-21 LAB — CBC: RBC: 3.54 — AB (ref 3.87–5.11)

## 2021-11-22 ENCOUNTER — Other Ambulatory Visit: Payer: Self-pay

## 2021-11-22 DIAGNOSIS — N179 Acute kidney failure, unspecified: Secondary | ICD-10-CM | POA: Diagnosis not present

## 2021-11-22 DIAGNOSIS — I700 Atherosclerosis of aorta: Secondary | ICD-10-CM | POA: Diagnosis not present

## 2021-11-22 DIAGNOSIS — Z950 Presence of cardiac pacemaker: Secondary | ICD-10-CM | POA: Diagnosis not present

## 2021-11-22 DIAGNOSIS — Z79891 Long term (current) use of opiate analgesic: Secondary | ICD-10-CM | POA: Diagnosis not present

## 2021-11-22 DIAGNOSIS — I10 Essential (primary) hypertension: Secondary | ICD-10-CM | POA: Diagnosis not present

## 2021-11-22 DIAGNOSIS — Z7984 Long term (current) use of oral hypoglycemic drugs: Secondary | ICD-10-CM | POA: Diagnosis not present

## 2021-11-22 DIAGNOSIS — M8588 Other specified disorders of bone density and structure, other site: Secondary | ICD-10-CM | POA: Diagnosis not present

## 2021-11-22 DIAGNOSIS — M109 Gout, unspecified: Secondary | ICD-10-CM | POA: Diagnosis not present

## 2021-11-22 DIAGNOSIS — E860 Dehydration: Secondary | ICD-10-CM | POA: Diagnosis not present

## 2021-11-22 DIAGNOSIS — Z7982 Long term (current) use of aspirin: Secondary | ICD-10-CM | POA: Diagnosis not present

## 2021-11-22 DIAGNOSIS — D638 Anemia in other chronic diseases classified elsewhere: Secondary | ICD-10-CM | POA: Insufficient documentation

## 2021-11-22 DIAGNOSIS — D649 Anemia, unspecified: Secondary | ICD-10-CM

## 2021-11-22 DIAGNOSIS — E1151 Type 2 diabetes mellitus with diabetic peripheral angiopathy without gangrene: Secondary | ICD-10-CM | POA: Diagnosis not present

## 2021-11-22 DIAGNOSIS — E872 Acidosis, unspecified: Secondary | ICD-10-CM | POA: Diagnosis not present

## 2021-11-22 DIAGNOSIS — I459 Conduction disorder, unspecified: Secondary | ICD-10-CM | POA: Diagnosis not present

## 2021-11-22 DIAGNOSIS — J439 Emphysema, unspecified: Secondary | ICD-10-CM | POA: Diagnosis not present

## 2021-11-22 DIAGNOSIS — J479 Bronchiectasis, uncomplicated: Secondary | ICD-10-CM | POA: Diagnosis not present

## 2021-11-22 LAB — TSH: TSH: 3.39 u[IU]/mL (ref 0.350–4.500)

## 2021-11-22 LAB — CBC: MCV: 91 (ref 80–94)

## 2021-11-22 NOTE — Progress Notes (Cosign Needed Addendum)
?Scott [name]  ?[address] ?[place],  [place]  [address] ?[phone] ? ?Clinic Day:  11/21/2021 ? ?Referring physician: Angelina Sheriff, MD ? ?ASSESSMENT & PLAN:  ? ?Assessment & Plan: ?Anemia in other chronic diseases classified elsewhere ?Anemia most likely secondary to chronic disease, as evaluation did not reveal any other specific etiology.  He could potentially have early myelodysplasia.  As his hemoglobin remains above 10, no treatment is recommended.  As he did not have evidence of iron deficiency, I will have him discontinue oral iron.  We recommend observation and we will plan to see him back in 3 months for repeat clinical assessment.  ? ?The patient and his daughter understands the plans discussed today and are in agreement with them.  They know to contact our office if he develops symptoms of worsening anemia prior to his next appointment. ? ? ?I provided 20 minutes of face-to-face time during this encounter and > 50% was spent counseling as documented under my assessment and plan.  ? ? ?Marvia Pickles, PA-C  ?[place] ?[place] ?[place] [address] ?Dept: [phone] ?Dept Fax: [phone]  ? ?Orders Placed This Encounter  ?Procedures  ? CBC and differential  ?  This external order was created through the Results Console.  ? CBC  ?  This external order was created through the Results Console.  ? Basic metabolic panel  ?  This external order was created through the Results Console.  ? Comprehensive metabolic panel  ?  This external order was created through the Results Console.  ? CBC  ?  This order was created through External Result Entry  ?  ? ? ?CHIEF COMPLAINT:  ?CC: Anemia ? ?Current Treatment: Observation ? ?HISTORY OF PRESENT ILLNESS:  ?Scott [name] is a 88 y.o. male with a history of mild anemia dating back at least to 2018, at which time he saw Dr. Bobby Rumpf in September.  Evaluation of his anemia did not reveal a specific etiology and because the anemia was mild his care was turned back over to Dr. Rex Kras.  He was referred back in April with worsening anemia. ? ?INTERVAL HISTORY:  ?Quamere is here today to review the results of his anemia evaluation.  Iron studies including a soluble transferrin did not reveal iron deficiency.  There was no evidence of hemolysis.  Serum protein electrophoresis did not reveal a monoclonal spike.  He denies progressive fatigue concerning for worsening anemia.  He is still recovering from his recent hospitalization.  He denies any overt form of blood loss. He denies fevers or chills. He denies pain. His appetite is good. His weight has been stable.  He and his daughter asked about his lower extremity edema again and I advised them to contact Dr. Lin Landsman. ? ?REVIEW OF SYSTEMS:  ?Review of Systems  ?Constitutional:  Positive for fatigue. Negative for appetite change, chills, fever and unexpected weight change.  ?HENT:   Negative for lump/mass, mouth sores and sore throat.   ?Respiratory:  Negative for cough and shortness of breath.   ?Cardiovascular:  Negative for chest pain and leg swelling.  ?Gastrointestinal:  Negative for abdominal pain, constipation, diarrhea, nausea and vomiting.  ?Genitourinary:  Negative for difficulty urinating, dysuria, frequency and hematuria.   ?Musculoskeletal:  Negative for arthralgias, back pain and myalgias.  ?Skin:  Negative for itching, rash and wound.  ?Neurological:  Negative for dizziness, extremity weakness, headaches,  light-headedness and numbness.  ?Hematological:  Negative for adenopathy.  ?Psychiatric/Behavioral:  Negative for depression and sleep disturbance. The patient is not nervous/anxious.    ? ?VITALS:  ?Blood pressure 140/65, pulse 74, temperature 98.2 ?F (36.8 ?C), temperature source Oral, resp. rate 18, height 5' 1.6" (1.565 m), weight 144 lb 12.8 oz (65.7 kg), SpO2 96 %.  ?Wt Readings from Last 3  Encounters:  ?11/21/21 144 lb 12.8 oz (65.7 kg)  ?11/10/21 144 lb 3.2 oz (65.4 kg)  ?08/04/21 149 lb (67.6 kg)  ?  ?Body mass index is 26.83 kg/m?. ? ?Performance status (ECOG): 1 - Symptomatic but completely ambulatory ? ?PHYSICAL EXAM:  ?Physical Exam ?Vitals and nursing note reviewed.  ?Constitutional:   ?   General: He is not in acute distress. ?   Appearance: Normal appearance. He is normal weight.  ?HENT:  ?   Head: Normocephalic and atraumatic.  ?   Mouth/Throat:  ?   Mouth: Mucous membranes are moist.  ?   Pharynx: Oropharynx is clear. No oropharyngeal exudate or posterior oropharyngeal erythema.  ?Eyes:  ?   General: No scleral icterus. ?   Extraocular Movements: Extraocular movements intact.  ?   Conjunctiva/sclera: Conjunctivae normal.  ?   Pupils: Pupils are equal, round, and reactive to light.  ?Cardiovascular:  ?   Rate and Rhythm: Normal rate and regular rhythm.  ?   Heart sounds: Normal heart sounds. No murmur heard. ?  No friction rub. No gallop.  ?Pulmonary:  ?   Effort: Pulmonary effort is normal.  ?   Breath sounds: Normal breath sounds. No wheezing, rhonchi or rales.  ?Abdominal:  ?   General: Bowel sounds are normal. There is no distension.  ?   Palpations: Abdomen is soft. There is no mass.  ?   Tenderness: There is no abdominal tenderness.  ?Musculoskeletal:     ?   General: Normal range of motion.  ?   Cervical back: Normal range of motion and neck supple. No tenderness.  ?   Right lower leg: Edema (1+_) present.  ?   Left lower leg: Edema (1+) present.  ?Lymphadenopathy:  ?   Cervical: No cervical adenopathy.  ?Skin: ?   General: Skin is warm and dry.  ?   Coloration: Skin is not jaundiced.  ?   Findings: No rash.  ?Neurological:  ?   Mental Status: He is alert and oriented to person, place, and time.  ?   Cranial Nerves: No cranial nerve deficit.  ?Psychiatric:     ?   Mood and Affect: Mood normal.     ?   Behavior: Behavior normal.     ?   Thought Content: Thought content normal.   ? ? ?LABS:  ? ? ?  Latest Ref Rng & Units 11/21/2021  ? 12:00 AM 11/10/2021  ? 12:00 AM 04/30/2009  ?  4:00 AM  ?CBC  ?WBC  10.8      7.1      7.8    ?Hemoglobin 13.5 - 17.5 10.3      10.3      12.2    ?Hematocrit 41 - 53 32      32      35.3    ?Platelets 150 - 400 K/uL 203      165      158    ?  ? This result is from an external source.  ? ? ?  Latest Ref Rng & Units 11/21/2021  ?  12:00 AM 11/10/2021  ? 12:00 AM 04/30/2009  ?  4:00 AM  ?CMP  ?Glucose 70 - 99 mg/dL   112    ?BUN 4 - '21 28      26      13    '$ ?Creatinine 0.6 - 1.3 1.3      1.2      1.18    ?Sodium 137 - 147 140      142      139    ?Potassium 3.5 - 5.1 mEq/L 4.4      4.3      4.0    ?Chloride 99 - 108 104      104      106    ?CO2 13 - '22 28      28      27    '$ ?Calcium 8.7 - 10.7 8.3      8.2      8.2    ?Alkaline Phos 25 - 125  90        ?AST 14 - 40  22        ?ALT 10 - 40 U/L  16        ?  ? This result is from an external source.  ? ? ? ?No results found for: CEA1 / No results found for: CEA1 ?No results found for: PSA1 ?No results found for: HXT056 ?No results found for: PVX480  ?Lab Results  ?Component Value Date  ? TOTALPROTELP 6.1 11/10/2021  ? ?Lab Results  ?Component Value Date  ? TIBC 282 11/11/2021  ? FERRITIN 207 11/10/2021  ? IRONPCTSAT 32 11/11/2021  ? ?Lab Results  ?Component Value Date  ? LDH 187 11/10/2021  ? ? ?STUDIES:  ?No results found.  ? ? ?HISTORY:  ? ?Past Medical History:  ?Diagnosis Date  ? B12 deficiency   ? BPH (benign prostatic hyperplasia)   ? Bradycardia   ? Carotid artery disease ([place])   ? Complete heart block ([place])   ? a. s/p MDT dual chamber PPM   ? Diabetes ([place])   ? Hyperlipidemia   ? Hypertension   ? Pacemaker   ? Pain in joint, pelvic region and thigh   ? PVD (peripheral vascular disease) ([place])   ? LICA 16-55% - PVD  ? ? ?Past Surgical History:  ?Procedure Laterality Date  ? CHOLECYSTECTOMY    ? CORONARY ANGIOPLASTY WITH STENT PLACEMENT  11/12/08  ? No angiographically significant CAD - mid to distal LAD small in  caliber, question a degree of vasospasm. LVEDP-85mHg, aoritc valve stenosis very mild 9.352mg.  ? PACEMAKER INSERTION  04/29/09  ? Medtronic Adapta L implanted by Dr klCaryl Comesor AV block  ? ? ?Family History  ?Problem R

## 2021-11-22 NOTE — Assessment & Plan Note (Addendum)
Anemia most likely secondary to chronic disease, as evaluation did not reveal any other specific etiology.  He could potentially have early myelodysplasia.  As his hemoglobin remains above 10, no treatment is recommended.  As he did not have evidence of iron deficiency, I will have him discontinue oral iron.  We recommend observation and we will plan to see him back in 3 months for repeat clinical assessment. ?

## 2021-12-13 DIAGNOSIS — M25561 Pain in right knee: Secondary | ICD-10-CM | POA: Diagnosis not present

## 2021-12-13 DIAGNOSIS — Z6822 Body mass index (BMI) 22.0-22.9, adult: Secondary | ICD-10-CM | POA: Diagnosis not present

## 2021-12-13 DIAGNOSIS — Z0000 Encounter for general adult medical examination without abnormal findings: Secondary | ICD-10-CM | POA: Diagnosis not present

## 2021-12-13 DIAGNOSIS — M1990 Unspecified osteoarthritis, unspecified site: Secondary | ICD-10-CM | POA: Diagnosis not present

## 2021-12-15 DIAGNOSIS — S93491A Sprain of other ligament of right ankle, initial encounter: Secondary | ICD-10-CM | POA: Diagnosis not present

## 2021-12-15 DIAGNOSIS — M25561 Pain in right knee: Secondary | ICD-10-CM | POA: Diagnosis not present

## 2022-01-02 ENCOUNTER — Ambulatory Visit (INDEPENDENT_AMBULATORY_CARE_PROVIDER_SITE_OTHER): Payer: Medicare Other

## 2022-01-02 DIAGNOSIS — I442 Atrioventricular block, complete: Secondary | ICD-10-CM | POA: Diagnosis not present

## 2022-01-03 DIAGNOSIS — R2689 Other abnormalities of gait and mobility: Secondary | ICD-10-CM | POA: Diagnosis not present

## 2022-01-03 DIAGNOSIS — I459 Conduction disorder, unspecified: Secondary | ICD-10-CM | POA: Diagnosis not present

## 2022-01-03 DIAGNOSIS — M1990 Unspecified osteoarthritis, unspecified site: Secondary | ICD-10-CM | POA: Diagnosis not present

## 2022-01-03 DIAGNOSIS — Z6822 Body mass index (BMI) 22.0-22.9, adult: Secondary | ICD-10-CM | POA: Diagnosis not present

## 2022-01-03 DIAGNOSIS — D638 Anemia in other chronic diseases classified elsewhere: Secondary | ICD-10-CM | POA: Diagnosis not present

## 2022-01-03 DIAGNOSIS — G629 Polyneuropathy, unspecified: Secondary | ICD-10-CM | POA: Diagnosis not present

## 2022-01-04 LAB — CUP PACEART REMOTE DEVICE CHECK
Battery Impedance: 3730 Ohm
Battery Remaining Longevity: 15 mo
Battery Voltage: 2.71 V
Brady Statistic AP VP Percent: 5 %
Brady Statistic AP VS Percent: 0 %
Brady Statistic AS VP Percent: 95 %
Brady Statistic AS VS Percent: 0 %
Date Time Interrogation Session: 20230620135835
Implantable Lead Implant Date: 20101014
Implantable Lead Implant Date: 20101014
Implantable Lead Location: 753859
Implantable Lead Location: 753860
Implantable Lead Model: 5076
Implantable Lead Model: 5076
Implantable Pulse Generator Implant Date: 20101014
Lead Channel Impedance Value: 421 Ohm
Lead Channel Impedance Value: 542 Ohm
Lead Channel Pacing Threshold Amplitude: 0.625 V
Lead Channel Pacing Threshold Amplitude: 0.625 V
Lead Channel Pacing Threshold Pulse Width: 0.4 ms
Lead Channel Pacing Threshold Pulse Width: 0.4 ms
Lead Channel Setting Pacing Amplitude: 1.5 V
Lead Channel Setting Pacing Amplitude: 2.5 V
Lead Channel Setting Pacing Pulse Width: 0.4 ms
Lead Channel Setting Sensing Sensitivity: 5.6 mV

## 2022-01-12 DIAGNOSIS — M4316 Spondylolisthesis, lumbar region: Secondary | ICD-10-CM | POA: Diagnosis not present

## 2022-01-12 DIAGNOSIS — M6281 Muscle weakness (generalized): Secondary | ICD-10-CM | POA: Diagnosis not present

## 2022-01-12 DIAGNOSIS — R531 Weakness: Secondary | ICD-10-CM | POA: Diagnosis not present

## 2022-01-12 DIAGNOSIS — M5416 Radiculopathy, lumbar region: Secondary | ICD-10-CM | POA: Diagnosis not present

## 2022-01-12 DIAGNOSIS — M48062 Spinal stenosis, lumbar region with neurogenic claudication: Secondary | ICD-10-CM | POA: Diagnosis not present

## 2022-01-13 DIAGNOSIS — R296 Repeated falls: Secondary | ICD-10-CM | POA: Diagnosis not present

## 2022-01-13 DIAGNOSIS — Z7401 Bed confinement status: Secondary | ICD-10-CM | POA: Diagnosis not present

## 2022-01-13 DIAGNOSIS — I10 Essential (primary) hypertension: Secondary | ICD-10-CM | POA: Diagnosis not present

## 2022-01-13 DIAGNOSIS — I959 Hypotension, unspecified: Secondary | ICD-10-CM | POA: Diagnosis not present

## 2022-01-13 DIAGNOSIS — R29898 Other symptoms and signs involving the musculoskeletal system: Secondary | ICD-10-CM | POA: Diagnosis not present

## 2022-01-13 DIAGNOSIS — E119 Type 2 diabetes mellitus without complications: Secondary | ICD-10-CM | POA: Diagnosis not present

## 2022-01-13 DIAGNOSIS — Z792 Long term (current) use of antibiotics: Secondary | ICD-10-CM | POA: Diagnosis not present

## 2022-01-13 DIAGNOSIS — G63 Polyneuropathy in diseases classified elsewhere: Secondary | ICD-10-CM | POA: Diagnosis not present

## 2022-01-13 DIAGNOSIS — M4856XA Collapsed vertebra, not elsewhere classified, lumbar region, initial encounter for fracture: Secondary | ICD-10-CM | POA: Diagnosis not present

## 2022-01-13 DIAGNOSIS — I2510 Atherosclerotic heart disease of native coronary artery without angina pectoris: Secondary | ICD-10-CM | POA: Diagnosis not present

## 2022-01-13 DIAGNOSIS — M4726 Other spondylosis with radiculopathy, lumbar region: Secondary | ICD-10-CM | POA: Diagnosis not present

## 2022-01-13 DIAGNOSIS — M1990 Unspecified osteoarthritis, unspecified site: Secondary | ICD-10-CM | POA: Diagnosis not present

## 2022-01-13 DIAGNOSIS — Z79899 Other long term (current) drug therapy: Secondary | ICD-10-CM | POA: Diagnosis not present

## 2022-01-13 DIAGNOSIS — M4854XA Collapsed vertebra, not elsewhere classified, thoracic region, initial encounter for fracture: Secondary | ICD-10-CM | POA: Diagnosis not present

## 2022-01-13 DIAGNOSIS — R531 Weakness: Secondary | ICD-10-CM | POA: Diagnosis not present

## 2022-01-13 DIAGNOSIS — M4316 Spondylolisthesis, lumbar region: Secondary | ICD-10-CM | POA: Diagnosis not present

## 2022-01-13 DIAGNOSIS — M5416 Radiculopathy, lumbar region: Secondary | ICD-10-CM | POA: Diagnosis not present

## 2022-01-13 DIAGNOSIS — N390 Urinary tract infection, site not specified: Secondary | ICD-10-CM | POA: Diagnosis not present

## 2022-01-13 DIAGNOSIS — Z7982 Long term (current) use of aspirin: Secondary | ICD-10-CM | POA: Diagnosis not present

## 2022-01-13 DIAGNOSIS — M48061 Spinal stenosis, lumbar region without neurogenic claudication: Secondary | ICD-10-CM | POA: Diagnosis not present

## 2022-01-13 DIAGNOSIS — Z950 Presence of cardiac pacemaker: Secondary | ICD-10-CM | POA: Diagnosis not present

## 2022-01-13 DIAGNOSIS — M6281 Muscle weakness (generalized): Secondary | ICD-10-CM | POA: Diagnosis not present

## 2022-01-13 DIAGNOSIS — D649 Anemia, unspecified: Secondary | ICD-10-CM | POA: Diagnosis not present

## 2022-01-13 DIAGNOSIS — B964 Proteus (mirabilis) (morganii) as the cause of diseases classified elsewhere: Secondary | ICD-10-CM | POA: Diagnosis not present

## 2022-01-13 DIAGNOSIS — J439 Emphysema, unspecified: Secondary | ICD-10-CM | POA: Diagnosis not present

## 2022-01-13 DIAGNOSIS — M48062 Spinal stenosis, lumbar region with neurogenic claudication: Secondary | ICD-10-CM | POA: Diagnosis not present

## 2022-01-13 DIAGNOSIS — Z7984 Long term (current) use of oral hypoglycemic drugs: Secondary | ICD-10-CM | POA: Diagnosis not present

## 2022-01-16 DIAGNOSIS — Z950 Presence of cardiac pacemaker: Secondary | ICD-10-CM | POA: Diagnosis not present

## 2022-01-16 DIAGNOSIS — I959 Hypotension, unspecified: Secondary | ICD-10-CM | POA: Diagnosis not present

## 2022-01-16 DIAGNOSIS — M4854XA Collapsed vertebra, not elsewhere classified, thoracic region, initial encounter for fracture: Secondary | ICD-10-CM | POA: Diagnosis not present

## 2022-01-16 DIAGNOSIS — I10 Essential (primary) hypertension: Secondary | ICD-10-CM | POA: Diagnosis not present

## 2022-01-16 DIAGNOSIS — M4856XA Collapsed vertebra, not elsewhere classified, lumbar region, initial encounter for fracture: Secondary | ICD-10-CM | POA: Diagnosis not present

## 2022-01-16 DIAGNOSIS — M48062 Spinal stenosis, lumbar region with neurogenic claudication: Secondary | ICD-10-CM | POA: Diagnosis not present

## 2022-01-16 DIAGNOSIS — D649 Anemia, unspecified: Secondary | ICD-10-CM | POA: Diagnosis not present

## 2022-01-16 DIAGNOSIS — Z7401 Bed confinement status: Secondary | ICD-10-CM | POA: Diagnosis not present

## 2022-01-16 DIAGNOSIS — S32020S Wedge compression fracture of second lumbar vertebra, sequela: Secondary | ICD-10-CM | POA: Diagnosis not present

## 2022-01-16 DIAGNOSIS — G63 Polyneuropathy in diseases classified elsewhere: Secondary | ICD-10-CM | POA: Diagnosis not present

## 2022-01-16 DIAGNOSIS — R531 Weakness: Secondary | ICD-10-CM | POA: Diagnosis not present

## 2022-01-16 DIAGNOSIS — M4800 Spinal stenosis, site unspecified: Secondary | ICD-10-CM | POA: Diagnosis not present

## 2022-01-16 DIAGNOSIS — M48061 Spinal stenosis, lumbar region without neurogenic claudication: Secondary | ICD-10-CM | POA: Diagnosis not present

## 2022-01-16 DIAGNOSIS — D509 Iron deficiency anemia, unspecified: Secondary | ICD-10-CM | POA: Diagnosis not present

## 2022-01-16 DIAGNOSIS — N390 Urinary tract infection, site not specified: Secondary | ICD-10-CM | POA: Diagnosis not present

## 2022-01-16 DIAGNOSIS — J439 Emphysema, unspecified: Secondary | ICD-10-CM | POA: Diagnosis not present

## 2022-01-16 DIAGNOSIS — L89150 Pressure ulcer of sacral region, unstageable: Secondary | ICD-10-CM | POA: Diagnosis not present

## 2022-01-16 DIAGNOSIS — E119 Type 2 diabetes mellitus without complications: Secondary | ICD-10-CM | POA: Diagnosis not present

## 2022-01-17 DIAGNOSIS — E119 Type 2 diabetes mellitus without complications: Secondary | ICD-10-CM | POA: Diagnosis not present

## 2022-01-17 DIAGNOSIS — M4800 Spinal stenosis, site unspecified: Secondary | ICD-10-CM | POA: Diagnosis not present

## 2022-01-17 DIAGNOSIS — I10 Essential (primary) hypertension: Secondary | ICD-10-CM | POA: Diagnosis not present

## 2022-01-17 DIAGNOSIS — D509 Iron deficiency anemia, unspecified: Secondary | ICD-10-CM | POA: Diagnosis not present

## 2022-01-19 DIAGNOSIS — I10 Essential (primary) hypertension: Secondary | ICD-10-CM | POA: Diagnosis not present

## 2022-01-19 DIAGNOSIS — D509 Iron deficiency anemia, unspecified: Secondary | ICD-10-CM | POA: Diagnosis not present

## 2022-01-19 DIAGNOSIS — E119 Type 2 diabetes mellitus without complications: Secondary | ICD-10-CM | POA: Diagnosis not present

## 2022-01-19 DIAGNOSIS — M4800 Spinal stenosis, site unspecified: Secondary | ICD-10-CM | POA: Diagnosis not present

## 2022-01-19 NOTE — Progress Notes (Signed)
Remote pacemaker transmission.   

## 2022-01-24 DIAGNOSIS — S32020S Wedge compression fracture of second lumbar vertebra, sequela: Secondary | ICD-10-CM | POA: Diagnosis not present

## 2022-01-24 DIAGNOSIS — M48062 Spinal stenosis, lumbar region with neurogenic claudication: Secondary | ICD-10-CM | POA: Diagnosis not present

## 2022-01-27 ENCOUNTER — Ambulatory Visit: Payer: Medicare Other | Admitting: Internal Medicine

## 2022-01-31 DIAGNOSIS — D509 Iron deficiency anemia, unspecified: Secondary | ICD-10-CM | POA: Diagnosis not present

## 2022-01-31 DIAGNOSIS — M4800 Spinal stenosis, site unspecified: Secondary | ICD-10-CM | POA: Diagnosis not present

## 2022-01-31 DIAGNOSIS — E119 Type 2 diabetes mellitus without complications: Secondary | ICD-10-CM | POA: Diagnosis not present

## 2022-02-07 DIAGNOSIS — M4800 Spinal stenosis, site unspecified: Secondary | ICD-10-CM | POA: Diagnosis not present

## 2022-02-08 DIAGNOSIS — L89150 Pressure ulcer of sacral region, unstageable: Secondary | ICD-10-CM | POA: Diagnosis not present

## 2022-02-08 DIAGNOSIS — M4800 Spinal stenosis, site unspecified: Secondary | ICD-10-CM | POA: Diagnosis not present

## 2022-02-16 DIAGNOSIS — D509 Iron deficiency anemia, unspecified: Secondary | ICD-10-CM | POA: Diagnosis not present

## 2022-02-16 DIAGNOSIS — M4800 Spinal stenosis, site unspecified: Secondary | ICD-10-CM | POA: Diagnosis not present

## 2022-02-16 DIAGNOSIS — E119 Type 2 diabetes mellitus without complications: Secondary | ICD-10-CM | POA: Diagnosis not present

## 2022-02-16 DIAGNOSIS — L89150 Pressure ulcer of sacral region, unstageable: Secondary | ICD-10-CM | POA: Diagnosis not present

## 2022-02-21 ENCOUNTER — Other Ambulatory Visit: Payer: Medicare Other

## 2022-02-21 ENCOUNTER — Ambulatory Visit: Payer: Medicare Other | Admitting: Hematology and Oncology

## 2022-02-23 ENCOUNTER — Ambulatory Visit: Payer: Medicare Other | Admitting: Internal Medicine

## 2022-02-25 DIAGNOSIS — L89154 Pressure ulcer of sacral region, stage 4: Secondary | ICD-10-CM | POA: Diagnosis not present

## 2022-03-02 DIAGNOSIS — R531 Weakness: Secondary | ICD-10-CM | POA: Diagnosis not present

## 2022-03-02 DIAGNOSIS — Z7401 Bed confinement status: Secondary | ICD-10-CM | POA: Diagnosis not present

## 2022-03-09 DIAGNOSIS — R197 Diarrhea, unspecified: Secondary | ICD-10-CM | POA: Diagnosis not present

## 2022-03-09 DIAGNOSIS — R252 Cramp and spasm: Secondary | ICD-10-CM | POA: Diagnosis not present

## 2022-03-10 DIAGNOSIS — R197 Diarrhea, unspecified: Secondary | ICD-10-CM | POA: Diagnosis not present

## 2022-03-13 DIAGNOSIS — R531 Weakness: Secondary | ICD-10-CM | POA: Diagnosis not present

## 2022-03-13 DIAGNOSIS — Z7401 Bed confinement status: Secondary | ICD-10-CM | POA: Diagnosis not present

## 2022-03-13 DIAGNOSIS — R5381 Other malaise: Secondary | ICD-10-CM | POA: Diagnosis not present

## 2022-03-15 DIAGNOSIS — L89150 Pressure ulcer of sacral region, unstageable: Secondary | ICD-10-CM | POA: Diagnosis not present

## 2022-03-16 DIAGNOSIS — Z7401 Bed confinement status: Secondary | ICD-10-CM | POA: Diagnosis not present

## 2022-03-16 DIAGNOSIS — M48062 Spinal stenosis, lumbar region with neurogenic claudication: Secondary | ICD-10-CM | POA: Diagnosis not present

## 2022-03-16 DIAGNOSIS — R531 Weakness: Secondary | ICD-10-CM | POA: Diagnosis not present

## 2022-03-16 DIAGNOSIS — Z743 Need for continuous supervision: Secondary | ICD-10-CM | POA: Diagnosis not present

## 2022-03-16 DIAGNOSIS — M5136 Other intervertebral disc degeneration, lumbar region: Secondary | ICD-10-CM | POA: Diagnosis not present

## 2022-03-16 DIAGNOSIS — I10 Essential (primary) hypertension: Secondary | ICD-10-CM | POA: Diagnosis not present

## 2022-03-18 DIAGNOSIS — R799 Abnormal finding of blood chemistry, unspecified: Secondary | ICD-10-CM | POA: Diagnosis not present

## 2022-03-21 ENCOUNTER — Encounter: Payer: Medicare Other | Admitting: Internal Medicine

## 2022-03-21 DIAGNOSIS — R197 Diarrhea, unspecified: Secondary | ICD-10-CM | POA: Diagnosis not present

## 2022-03-21 DIAGNOSIS — M4800 Spinal stenosis, site unspecified: Secondary | ICD-10-CM | POA: Diagnosis not present

## 2022-03-21 DIAGNOSIS — R252 Cramp and spasm: Secondary | ICD-10-CM | POA: Diagnosis not present

## 2022-03-21 DIAGNOSIS — R799 Abnormal finding of blood chemistry, unspecified: Secondary | ICD-10-CM | POA: Diagnosis not present

## 2022-03-22 DIAGNOSIS — E875 Hyperkalemia: Secondary | ICD-10-CM | POA: Diagnosis not present

## 2022-03-22 DIAGNOSIS — E860 Dehydration: Secondary | ICD-10-CM | POA: Diagnosis not present

## 2022-03-23 DIAGNOSIS — N3289 Other specified disorders of bladder: Secondary | ICD-10-CM | POA: Diagnosis not present

## 2022-03-23 DIAGNOSIS — M48062 Spinal stenosis, lumbar region with neurogenic claudication: Secondary | ICD-10-CM | POA: Diagnosis not present

## 2022-03-23 DIAGNOSIS — E119 Type 2 diabetes mellitus without complications: Secondary | ICD-10-CM | POA: Diagnosis not present

## 2022-03-23 DIAGNOSIS — R531 Weakness: Secondary | ICD-10-CM | POA: Diagnosis not present

## 2022-03-23 DIAGNOSIS — D509 Iron deficiency anemia, unspecified: Secondary | ICD-10-CM | POA: Diagnosis not present

## 2022-03-23 DIAGNOSIS — K5790 Diverticulosis of intestine, part unspecified, without perforation or abscess without bleeding: Secondary | ICD-10-CM | POA: Diagnosis not present

## 2022-03-23 DIAGNOSIS — L89159 Pressure ulcer of sacral region, unspecified stage: Secondary | ICD-10-CM | POA: Diagnosis not present

## 2022-03-23 DIAGNOSIS — R5381 Other malaise: Secondary | ICD-10-CM | POA: Diagnosis not present

## 2022-03-23 DIAGNOSIS — R799 Abnormal finding of blood chemistry, unspecified: Secondary | ICD-10-CM | POA: Diagnosis not present

## 2022-03-23 DIAGNOSIS — Z7401 Bed confinement status: Secondary | ICD-10-CM | POA: Diagnosis not present

## 2022-03-23 DIAGNOSIS — L89150 Pressure ulcer of sacral region, unstageable: Secondary | ICD-10-CM | POA: Diagnosis not present

## 2022-03-23 DIAGNOSIS — I10 Essential (primary) hypertension: Secondary | ICD-10-CM | POA: Diagnosis not present

## 2022-03-24 DIAGNOSIS — E119 Type 2 diabetes mellitus without complications: Secondary | ICD-10-CM | POA: Diagnosis not present

## 2022-03-24 DIAGNOSIS — L89150 Pressure ulcer of sacral region, unstageable: Secondary | ICD-10-CM | POA: Diagnosis not present

## 2022-03-29 DIAGNOSIS — L89154 Pressure ulcer of sacral region, stage 4: Secondary | ICD-10-CM | POA: Diagnosis not present

## 2022-04-03 DIAGNOSIS — M2011 Hallux valgus (acquired), right foot: Secondary | ICD-10-CM | POA: Diagnosis not present

## 2022-04-03 DIAGNOSIS — I739 Peripheral vascular disease, unspecified: Secondary | ICD-10-CM | POA: Diagnosis not present

## 2022-04-03 DIAGNOSIS — M2012 Hallux valgus (acquired), left foot: Secondary | ICD-10-CM | POA: Diagnosis not present

## 2022-04-05 DIAGNOSIS — E875 Hyperkalemia: Secondary | ICD-10-CM | POA: Diagnosis not present

## 2022-04-07 DIAGNOSIS — E875 Hyperkalemia: Secondary | ICD-10-CM | POA: Diagnosis not present

## 2022-04-07 DIAGNOSIS — E860 Dehydration: Secondary | ICD-10-CM | POA: Diagnosis not present

## 2022-04-08 DIAGNOSIS — R799 Abnormal finding of blood chemistry, unspecified: Secondary | ICD-10-CM | POA: Diagnosis not present

## 2022-04-15 DIAGNOSIS — R799 Abnormal finding of blood chemistry, unspecified: Secondary | ICD-10-CM | POA: Diagnosis not present

## 2022-04-18 DIAGNOSIS — E860 Dehydration: Secondary | ICD-10-CM | POA: Diagnosis not present

## 2022-04-18 DIAGNOSIS — E875 Hyperkalemia: Secondary | ICD-10-CM | POA: Diagnosis not present

## 2022-04-19 ENCOUNTER — Encounter: Payer: Medicare Other | Admitting: Internal Medicine

## 2022-04-19 DIAGNOSIS — I442 Atrioventricular block, complete: Secondary | ICD-10-CM

## 2022-04-19 DIAGNOSIS — Z950 Presence of cardiac pacemaker: Secondary | ICD-10-CM

## 2022-04-19 DIAGNOSIS — I5032 Chronic diastolic (congestive) heart failure: Secondary | ICD-10-CM

## 2022-04-20 DIAGNOSIS — F32A Depression, unspecified: Secondary | ICD-10-CM | POA: Diagnosis not present

## 2022-04-20 DIAGNOSIS — E119 Type 2 diabetes mellitus without complications: Secondary | ICD-10-CM | POA: Diagnosis not present

## 2022-04-20 DIAGNOSIS — D509 Iron deficiency anemia, unspecified: Secondary | ICD-10-CM | POA: Diagnosis not present

## 2022-04-20 DIAGNOSIS — L89150 Pressure ulcer of sacral region, unstageable: Secondary | ICD-10-CM | POA: Diagnosis not present

## 2022-04-25 DIAGNOSIS — M48062 Spinal stenosis, lumbar region with neurogenic claudication: Secondary | ICD-10-CM | POA: Diagnosis not present

## 2022-04-25 DIAGNOSIS — E119 Type 2 diabetes mellitus without complications: Secondary | ICD-10-CM | POA: Diagnosis not present

## 2022-04-25 DIAGNOSIS — R1312 Dysphagia, oropharyngeal phase: Secondary | ICD-10-CM | POA: Diagnosis not present

## 2022-04-27 DIAGNOSIS — R1312 Dysphagia, oropharyngeal phase: Secondary | ICD-10-CM | POA: Diagnosis not present

## 2022-04-27 DIAGNOSIS — M48062 Spinal stenosis, lumbar region with neurogenic claudication: Secondary | ICD-10-CM | POA: Diagnosis not present

## 2022-04-28 ENCOUNTER — Ambulatory Visit (INDEPENDENT_AMBULATORY_CARE_PROVIDER_SITE_OTHER): Payer: Medicare Other

## 2022-04-28 DIAGNOSIS — I442 Atrioventricular block, complete: Secondary | ICD-10-CM

## 2022-04-28 LAB — CUP PACEART REMOTE DEVICE CHECK
Battery Impedance: 4075 Ohm
Battery Remaining Longevity: 13 mo
Battery Voltage: 2.69 V
Brady Statistic AP VP Percent: 3 %
Brady Statistic AP VS Percent: 0 %
Brady Statistic AS VP Percent: 97 %
Brady Statistic AS VS Percent: 0 %
Date Time Interrogation Session: 20231012174412
Implantable Lead Implant Date: 20101014
Implantable Lead Implant Date: 20101014
Implantable Lead Location: 753859
Implantable Lead Location: 753860
Implantable Lead Model: 5076
Implantable Lead Model: 5076
Implantable Pulse Generator Implant Date: 20101014
Lead Channel Impedance Value: 416 Ohm
Lead Channel Impedance Value: 549 Ohm
Lead Channel Pacing Threshold Amplitude: 0.625 V
Lead Channel Pacing Threshold Amplitude: 0.625 V
Lead Channel Pacing Threshold Pulse Width: 0.4 ms
Lead Channel Pacing Threshold Pulse Width: 0.4 ms
Lead Channel Setting Pacing Amplitude: 1.5 V
Lead Channel Setting Pacing Amplitude: 2.5 V
Lead Channel Setting Pacing Pulse Width: 0.4 ms
Lead Channel Setting Sensing Sensitivity: 5.6 mV

## 2022-05-03 DIAGNOSIS — R1312 Dysphagia, oropharyngeal phase: Secondary | ICD-10-CM | POA: Diagnosis not present

## 2022-05-03 DIAGNOSIS — M48062 Spinal stenosis, lumbar region with neurogenic claudication: Secondary | ICD-10-CM | POA: Diagnosis not present

## 2022-05-03 NOTE — Progress Notes (Signed)
Remote pacemaker transmission.   

## 2022-05-04 DIAGNOSIS — M48062 Spinal stenosis, lumbar region with neurogenic claudication: Secondary | ICD-10-CM | POA: Diagnosis not present

## 2022-05-04 DIAGNOSIS — R1312 Dysphagia, oropharyngeal phase: Secondary | ICD-10-CM | POA: Diagnosis not present

## 2022-05-05 DIAGNOSIS — R1312 Dysphagia, oropharyngeal phase: Secondary | ICD-10-CM | POA: Diagnosis not present

## 2022-05-05 DIAGNOSIS — M48062 Spinal stenosis, lumbar region with neurogenic claudication: Secondary | ICD-10-CM | POA: Diagnosis not present

## 2022-05-08 DIAGNOSIS — R1312 Dysphagia, oropharyngeal phase: Secondary | ICD-10-CM | POA: Diagnosis not present

## 2022-05-08 DIAGNOSIS — M48062 Spinal stenosis, lumbar region with neurogenic claudication: Secondary | ICD-10-CM | POA: Diagnosis not present

## 2022-05-10 DIAGNOSIS — R1312 Dysphagia, oropharyngeal phase: Secondary | ICD-10-CM | POA: Diagnosis not present

## 2022-05-10 DIAGNOSIS — M48062 Spinal stenosis, lumbar region with neurogenic claudication: Secondary | ICD-10-CM | POA: Diagnosis not present

## 2022-05-11 DIAGNOSIS — R1312 Dysphagia, oropharyngeal phase: Secondary | ICD-10-CM | POA: Diagnosis not present

## 2022-05-11 DIAGNOSIS — M48062 Spinal stenosis, lumbar region with neurogenic claudication: Secondary | ICD-10-CM | POA: Diagnosis not present

## 2022-05-12 DIAGNOSIS — R1312 Dysphagia, oropharyngeal phase: Secondary | ICD-10-CM | POA: Diagnosis not present

## 2022-05-12 DIAGNOSIS — I959 Hypotension, unspecified: Secondary | ICD-10-CM | POA: Diagnosis not present

## 2022-05-12 DIAGNOSIS — R0602 Shortness of breath: Secondary | ICD-10-CM | POA: Diagnosis not present

## 2022-05-12 DIAGNOSIS — M48062 Spinal stenosis, lumbar region with neurogenic claudication: Secondary | ICD-10-CM | POA: Diagnosis not present

## 2022-05-12 DIAGNOSIS — E119 Type 2 diabetes mellitus without complications: Secondary | ICD-10-CM | POA: Diagnosis not present

## 2022-05-13 DIAGNOSIS — M48062 Spinal stenosis, lumbar region with neurogenic claudication: Secondary | ICD-10-CM | POA: Diagnosis not present

## 2022-05-13 DIAGNOSIS — D72829 Elevated white blood cell count, unspecified: Secondary | ICD-10-CM | POA: Diagnosis not present

## 2022-05-13 DIAGNOSIS — I959 Hypotension, unspecified: Secondary | ICD-10-CM | POA: Diagnosis not present

## 2022-05-13 DIAGNOSIS — E860 Dehydration: Secondary | ICD-10-CM | POA: Diagnosis not present

## 2022-05-13 DIAGNOSIS — R1312 Dysphagia, oropharyngeal phase: Secondary | ICD-10-CM | POA: Diagnosis not present

## 2022-05-15 DIAGNOSIS — I959 Hypotension, unspecified: Secondary | ICD-10-CM | POA: Diagnosis not present

## 2022-05-15 DIAGNOSIS — R1312 Dysphagia, oropharyngeal phase: Secondary | ICD-10-CM | POA: Diagnosis not present

## 2022-05-15 DIAGNOSIS — D72829 Elevated white blood cell count, unspecified: Secondary | ICD-10-CM | POA: Diagnosis not present

## 2022-05-15 DIAGNOSIS — M48062 Spinal stenosis, lumbar region with neurogenic claudication: Secondary | ICD-10-CM | POA: Diagnosis not present

## 2022-05-15 DIAGNOSIS — E860 Dehydration: Secondary | ICD-10-CM | POA: Diagnosis not present

## 2022-05-15 DIAGNOSIS — N390 Urinary tract infection, site not specified: Secondary | ICD-10-CM | POA: Diagnosis not present

## 2022-05-16 DIAGNOSIS — E860 Dehydration: Secondary | ICD-10-CM | POA: Diagnosis not present

## 2022-05-16 DIAGNOSIS — E119 Type 2 diabetes mellitus without complications: Secondary | ICD-10-CM | POA: Diagnosis not present

## 2022-05-16 DIAGNOSIS — D72829 Elevated white blood cell count, unspecified: Secondary | ICD-10-CM | POA: Diagnosis not present

## 2022-05-16 DIAGNOSIS — I959 Hypotension, unspecified: Secondary | ICD-10-CM | POA: Diagnosis not present

## 2022-05-17 DIAGNOSIS — R799 Abnormal finding of blood chemistry, unspecified: Secondary | ICD-10-CM | POA: Diagnosis not present

## 2022-05-17 DIAGNOSIS — R1312 Dysphagia, oropharyngeal phase: Secondary | ICD-10-CM | POA: Diagnosis not present

## 2022-05-17 DIAGNOSIS — M48062 Spinal stenosis, lumbar region with neurogenic claudication: Secondary | ICD-10-CM | POA: Diagnosis not present

## 2022-05-18 DIAGNOSIS — D649 Anemia, unspecified: Secondary | ICD-10-CM | POA: Diagnosis not present

## 2022-05-18 DIAGNOSIS — I959 Hypotension, unspecified: Secondary | ICD-10-CM | POA: Diagnosis not present

## 2022-05-18 DIAGNOSIS — R1312 Dysphagia, oropharyngeal phase: Secondary | ICD-10-CM | POA: Diagnosis not present

## 2022-05-18 DIAGNOSIS — M48062 Spinal stenosis, lumbar region with neurogenic claudication: Secondary | ICD-10-CM | POA: Diagnosis not present

## 2022-05-18 DIAGNOSIS — E860 Dehydration: Secondary | ICD-10-CM | POA: Diagnosis not present

## 2022-05-19 DIAGNOSIS — E119 Type 2 diabetes mellitus without complications: Secondary | ICD-10-CM | POA: Diagnosis not present

## 2022-05-22 DIAGNOSIS — E861 Hypovolemia: Secondary | ICD-10-CM | POA: Diagnosis not present

## 2022-05-22 DIAGNOSIS — M48062 Spinal stenosis, lumbar region with neurogenic claudication: Secondary | ICD-10-CM | POA: Diagnosis not present

## 2022-05-22 DIAGNOSIS — U071 COVID-19: Secondary | ICD-10-CM | POA: Diagnosis not present

## 2022-05-22 DIAGNOSIS — R1312 Dysphagia, oropharyngeal phase: Secondary | ICD-10-CM | POA: Diagnosis not present

## 2022-05-22 DIAGNOSIS — L89150 Pressure ulcer of sacral region, unstageable: Secondary | ICD-10-CM | POA: Diagnosis not present

## 2022-05-23 DIAGNOSIS — Z712 Person consulting for explanation of examination or test findings: Secondary | ICD-10-CM | POA: Diagnosis not present

## 2022-05-25 DIAGNOSIS — R197 Diarrhea, unspecified: Secondary | ICD-10-CM | POA: Diagnosis not present

## 2022-05-31 ENCOUNTER — Telehealth: Payer: Self-pay

## 2022-05-31 NOTE — Telephone Encounter (Signed)
I spoke with the patient daughter Rise Paganini. I gave her my deepest condolences. I let her know that I will order the patient a return kit. She should have it in 7-10 business days. Rise Paganini verbalized understanding and thanked me for the call.

## 2022-06-16 DEATH — deceased

## 2022-06-28 ENCOUNTER — Encounter: Payer: Medicare Other | Admitting: Internal Medicine
